# Patient Record
Sex: Female | Born: 1970 | Hispanic: Refuse to answer | Marital: Married | Smoking: Former smoker
Health system: Southern US, Community
[De-identification: ages and names within clinical notes are randomized; demographics above are authoritative.]

## PROBLEM LIST (undated history)

## (undated) ENCOUNTER — Other Ambulatory Visit: Admission: RE | Payer: Self-pay | Source: Ambulatory Visit | Admitting: Primary Care

## (undated) DIAGNOSIS — M329 Systemic lupus erythematosus, unspecified: Secondary | ICD-10-CM

## (undated) DIAGNOSIS — D75839 Thrombocytosis, unspecified: Secondary | ICD-10-CM

## (undated) DIAGNOSIS — IMO0002 Reserved for concepts with insufficient information to code with codable children: Secondary | ICD-10-CM

## (undated) DIAGNOSIS — D6861 Antiphospholipid syndrome: Secondary | ICD-10-CM

## (undated) DIAGNOSIS — R58 Hemorrhage, not elsewhere classified: Secondary | ICD-10-CM

## (undated) DIAGNOSIS — I10 Essential (primary) hypertension: Secondary | ICD-10-CM

## (undated) DIAGNOSIS — G43909 Migraine, unspecified, not intractable, without status migrainosus: Secondary | ICD-10-CM

## (undated) DIAGNOSIS — K219 Gastro-esophageal reflux disease without esophagitis: Secondary | ICD-10-CM

## (undated) DIAGNOSIS — F419 Anxiety disorder, unspecified: Secondary | ICD-10-CM

## (undated) HISTORY — PX: CARPAL TUNNEL RELEASE: SHX101

## (undated) HISTORY — PX: KNEE ARTHROSCOPY W/ LATERAL RETINACULAR REPAIR: SHX1875

## (undated) HISTORY — DX: Migraine, unspecified, not intractable, without status migrainosus: G43.909

## (undated) HISTORY — DX: Anxiety disorder, unspecified: F41.9

## (undated) HISTORY — DX: Gastro-esophageal reflux disease without esophagitis: K21.9

## (undated) HISTORY — DX: Essential (primary) hypertension: I10

## (undated) HISTORY — DX: Antiphospholipid syndrome: D68.61

## (undated) HISTORY — DX: Hemorrhage, not elsewhere classified: R58

---

## 2000-06-01 ENCOUNTER — Encounter: Payer: Self-pay | Admitting: Cardiovascular Disease

## 2009-11-25 ENCOUNTER — Emergency Department: Admit: 2009-11-25 | Disposition: A | Discharge status: Home / Self Care | Payer: Self-pay | Source: Ambulatory Visit

## 2009-11-25 ENCOUNTER — Encounter: Payer: Self-pay | Admitting: Medical

## 2009-11-25 DIAGNOSIS — M329 Systemic lupus erythematosus, unspecified: Secondary | ICD-10-CM | POA: Insufficient documentation

## 2009-11-25 HISTORY — DX: Systemic lupus erythematosus, unspecified: M32.9

## 2009-11-25 LAB — CBC AND DIFFERENTIAL
Baso # K/uL: 0 THOU/uL (ref 0.0–0.1)
Basophil %: 0.4 % (ref 0.1–1.2)
Eos # K/uL: 0.3 THOU/uL (ref 0.0–0.4)
Eosinophil %: 3 % (ref 0.7–5.8)
Hematocrit: 39 % (ref 34–45)
Hemoglobin: 13.2 g/dL (ref 11.2–15.7)
Lymph # K/uL: 1.9 THOU/uL (ref 1.2–3.7)
Lymphocyte %: 22.6 % (ref 19.3–51.7)
MCV: 87 fL (ref 79–95)
Mono # K/uL: 0.7 THOU/uL (ref 0.2–0.9)
Monocyte %: 8.3 % (ref 4.7–12.5)
Neut # K/uL: 5.5 THOU/uL (ref 1.6–6.1)
RBC: 4.5 MIL/uL (ref 3.9–5.2)
RDW: 12.8 % (ref 11.7–14.4)
Seg Neut %: 65.3 % (ref 34.0–71.1)
WBC: 8.4 THOU/uL (ref 4.0–10.0)

## 2009-11-25 LAB — COMPREHENSIVE METABOLIC PANEL
ALT: 17 U/L (ref 0–35)
AST: 27 U/L (ref 0–35)
Albumin: 4.6 g/dL (ref 3.5–5.2)
Alk Phos: 31 U/L — ABNORMAL LOW (ref 35–105)
Anion Gap: 11 (ref 7–16)
Bilirubin,Total: 0.3 mg/dL (ref 0.0–1.2)
CO2: 27 mmol/L (ref 20–28)
Calcium: 9 mg/dL (ref 8.8–10.2)
Chloride: 102 mmol/L (ref 96–108)
Creatinine: 0.97 mg/dL — ABNORMAL HIGH (ref 0.51–0.95)
GFR,Black: >59 *
GFR,Caucasian: >59 *
Globulin: 2.7 g/dL (ref 2.7–4.3)
Glucose: 94 mg/dL (ref 74–106)
Lab: 15 mg/dL (ref 6–20)
Potassium: 4.6 mmol/L (ref 3.3–5.1)
Sodium: 140 mmol/L (ref 133–145)
Total Protein: 7.3 g/dL (ref 6.3–7.7)

## 2009-11-25 LAB — LIPASE: Lipase: 25 U/L (ref 13–60)

## 2009-11-25 LAB — POCT URINALYSIS DIPSTICK
Blood,UA: NEGATIVE
Glucose,UA: NORMAL
Ketones, UA: NEGATIVE
Lot #: 20419701
Nitrite,UA POCT: NEGATIVE
PH,Ur: 5

## 2009-11-25 LAB — POCT URINE PREGNANCY
Lot #: 204187
Preg Test,UR POC: NEGATIVE

## 2009-11-25 LAB — SLIDE NUMBER: Slide # (Heme): 2112

## 2009-11-25 LAB — AMYLASE: Amylase: 61 U/L (ref 28–100)

## 2009-11-25 MED ORDER — ONDANSETRON HCL 2 MG/ML IV SOLN *I*
4.0000 mg | Freq: Once | INTRAMUSCULAR | Status: DC
Start: 2009-11-25 — End: 2009-11-26

## 2009-11-25 MED ORDER — SODIUM CHLORIDE 0.9 % IV BOLUS *I*
1000.0000 mL | Status: DC
Start: 2009-11-25 — End: 2009-11-26
  Administered 2009-11-25: 1000 mL via INTRAVENOUS

## 2009-11-25 MED ORDER — ONDANSETRON HCL 2 MG/ML IV SOLN *I*
INTRAMUSCULAR | Status: AC
Start: 2009-11-25 — End: 2009-11-25
  Administered 2009-11-25: 4 mg
  Filled 2009-11-25: qty 2

## 2009-11-25 MED ORDER — MORPHINE SULFATE 4 MG/ML IJ SOLN
4.0000 mg | Freq: Once | INTRAMUSCULAR | Status: DC
Start: 2009-11-25 — End: 2009-11-26

## 2009-11-25 MED ORDER — MORPHINE SULFATE 4 MG/ML IJ SOLN
INTRAMUSCULAR | Status: AC
Start: 2009-11-25 — End: 2009-11-25
  Administered 2009-11-25: 4 mg
  Filled 2009-11-25: qty 1

## 2009-11-25 NOTE — ED Notes (Signed)
 Upper abd pain x 1 day, nauseated but denies any emesis

## 2009-11-25 NOTE — ED Notes (Signed)
To US via stretcher.

## 2009-11-25 NOTE — ED Provider Notes (Signed)
 History   Chief Complaint   Patient presents with   . Abdominal Pain       HPI Comments: 39 yo patient with history of lupus, presents with abdominal pain. Patient states "I think its my gall bladder". Patient has epigastric/RUQ abdominal pain for the last day after an episode of drinking the night before. Pain is worse with eating. Mild radiation of pain to the upper back. Denies fevers, chills, vomiting, diarrhea, urinary/vaginal symptoms. Patient reports a strong family history of having cholecystectomies at young ages.     Patient is a 39 y.o. female presenting with abdominal pain. The history is provided by the patient. No language interpreter was used.   Abdominal Pain  The primary symptoms of the illness include abdominal pain and nausea. The primary symptoms of the illness do not include fever, fatigue, shortness of breath, vomiting, diarrhea, hematemesis, hematochezia, dysuria, vaginal discharge or vaginal bleeding. The current episode started yesterday. The onset of the illness was gradual. The problem has been gradually worsening.   The abdominal pain is located in the epigastric region and RUQ. The abdominal pain radiates to the back. The abdominal pain is exacerbated by eating.   The illness is associated with alcohol use and eating. The patient states that she believes she is currently not pregnant. The patient has not had a change in bowel habit. Additional symptoms associated with the illness include anorexia, heartburn and back pain. Symptoms associated with the illness do not include chills, diaphoresis, constipation, urgency, hematuria or frequency.       Past Medical History   Diagnosis Date   . Lupus          History reviewed.  No pertinent past surgical history.    History reviewed.  No pertinent family history.     reports that she drinks alcohol.  She reports that she does not currently use illicit drugs.    Review of Systems   Review of Systems   Constitutional: Negative for fever,  chills, diaphoresis and fatigue.   Respiratory: Negative for chest tightness and shortness of breath.    Cardiovascular: Negative for chest pain.   Gastrointestinal: Positive for heartburn, nausea, abdominal pain and anorexia. Negative for vomiting, diarrhea, constipation, hematochezia, abdominal distention, anal bleeding and hematemesis.   Genitourinary: Negative for dysuria, urgency, frequency, hematuria, vaginal bleeding and vaginal discharge.   Musculoskeletal: Positive for back pain.   Neurological: Negative for weakness and numbness.       Physical Exam   BP 151/97  Pulse 61  Temp(Src) 36.2 C (97.2 F) (Temporal)  Resp 18  Ht 1.676 m (5\' 6" )  Wt 101.152 kg (223 lb)  BMI 35.99 kg/m2  SpO2 100%  LMP 11/01/2009    Physical Exam   Nursing note and vitals reviewed.  Constitutional: She is oriented to person, place, and time. She appears well-developed and well-nourished.   HENT:   Head: Normocephalic and atraumatic.   Eyes: Conjunctivae and EOM are normal. Pupils are equal, round, and reactive to light.   Cardiovascular: Normal rate, regular rhythm and normal heart sounds.  Exam reveals no gallop and no friction rub.    No murmur heard.  Pulmonary/Chest: Effort normal and breath sounds normal. No respiratory distress. She has no decreased breath sounds. She has no wheezes. She has no rhonchi. She has no rales. She exhibits no tenderness.   Abdominal: Soft. Bowel sounds are normal. She exhibits no distension and no mass. Tenderness is present in the right upper quadrant and  epigastric area. She has no rigidity, no rebound, no guarding, no CVA tenderness, no pain at McBurney's point and no Murphy's sign.        Musculoskeletal: Normal range of motion. She exhibits no edema and no tenderness.   Neurological: She is alert and oriented to person, place, and time. She has normal strength. No cranial nerve deficit or sensory deficit.   Skin: Skin is warm, dry and intact.       Medical Decision Making    MDM  Number of Diagnoses or Management Options  Diagnosis management comments: Patient seen by me today, 11/25/2009 at 2225    Assessment:  39 y.o., female comes to the ED with abdominal pain  Differential Diagnosis includes gastritis, cholecystitis/lithiasis, uti, preg, generalized abd. pain  Plan: labs, ivf, analgesia, urine dip/preg, u/s, reassess.          Amount and/or Complexity of Data Reviewed  Clinical lab tests: ordered and reviewed  Tests in the radiology section of CPT: ordered and reviewed    Risk of Complications, Morbidity, and/or Mortality  Presenting problems: moderate  Diagnostic procedures: moderate  Management options: moderate          Erlinda Hong, PA

## 2009-11-26 ENCOUNTER — Encounter: Payer: Self-pay | Admitting: Medical

## 2009-11-26 LAB — COMMENTS, DIFF

## 2009-11-26 NOTE — Discharge Instructions (Signed)
Be sure to follow up with your primary care physician as soon as possible for further evaluation of your symptoms.  Take home medication as prescribed.  Take percocet that you have at home as prescribed for your pain.  Return to the ED with changing/worsening of your symptoms, or with any other concerns.

## 2011-11-28 ENCOUNTER — Other Ambulatory Visit: Payer: Self-pay | Admitting: Gastroenterology

## 2011-11-28 ENCOUNTER — Emergency Department
Admit: 2011-11-28 | Disposition: A | Discharge status: Home / Self Care | Payer: Self-pay | Source: Ambulatory Visit | Attending: Emergency Medicine | Admitting: Emergency Medicine

## 2011-11-28 ENCOUNTER — Encounter: Payer: Self-pay | Admitting: Emergency Medicine

## 2011-11-28 LAB — CBC AND DIFFERENTIAL
Baso # K/uL: 0 10*3/uL (ref 0.0–0.1)
Basophil %: 0.3 % (ref 0.1–1.2)
Eos # K/uL: 0.2 10*3/uL (ref 0.0–0.4)
Eosinophil %: 2.5 % (ref 0.7–5.8)
Hematocrit: 41 % (ref 34–45)
Hemoglobin: 13.5 g/dL (ref 11.2–15.7)
Lymph # K/uL: 1.7 10*3/uL (ref 1.2–3.7)
Lymphocyte %: 28.7 % (ref 19.3–51.7)
MCV: 88 fL (ref 79–95)
Mono # K/uL: 0.5 10*3/uL (ref 0.2–0.9)
Monocyte %: 8.7 % (ref 4.7–12.5)
Neut # K/uL: 3.5 10*3/uL (ref 1.6–6.1)
Platelets: 97 10*3/uL — ABNORMAL LOW (ref 160–370)
RBC: 4.6 MIL/uL (ref 3.9–5.2)
RDW: 12.8 % (ref 11.7–14.4)
Seg Neut %: 59.5 % (ref 34.0–71.1)
WBC: 6 10*3/uL (ref 4.0–10.0)

## 2011-11-28 LAB — BASIC METABOLIC PANEL
Anion Gap: 9 (ref 7–16)
CO2: 25 mmol/L (ref 20–28)
Calcium: 8.5 mg/dL — ABNORMAL LOW (ref 8.8–10.2)
Chloride: 103 mmol/L (ref 96–108)
Creatinine: 1.02 mg/dL — ABNORMAL HIGH (ref 0.51–0.95)
GFR,Black: 79 *
GFR,Caucasian: 69 *
Glucose: 82 mg/dL (ref 60–99)
Lab: 18 mg/dL (ref 6–20)
Potassium: 4.2 mmol/L (ref 3.3–5.1)
Sodium: 137 mmol/L (ref 133–145)

## 2011-11-28 LAB — LACTATE, VENOUS, WHOLE BLOOD: Lactate VEN,WB: 0.6 mmol/L (ref 0.5–2.2)

## 2011-11-28 MED ORDER — ACETAMINOPHEN-CODEINE 300-30 MG PO TABS *I*
1.0000 | ORAL_TABLET | ORAL | Status: AC | PRN
Start: 2011-11-28 — End: 2011-12-01

## 2011-11-28 MED ORDER — ALBUTEROL SULFATE HFA 108 (90 BASE) MCG/ACT IN AERS *I*
2.0000 | INHALATION_SPRAY | Freq: Once | RESPIRATORY_TRACT | Status: DC
Start: 2011-11-28 — End: 2011-11-29

## 2011-11-28 MED ORDER — ACETAMINOPHEN-CODEINE 300-30 MG PO TABS *I*
2.0000 | ORAL_TABLET | Freq: Once | ORAL | Status: AC
Start: 2011-11-28 — End: 2011-11-28
  Administered 2011-11-28: 2 via ORAL
  Filled 2011-11-28: qty 2

## 2011-11-28 MED ORDER — SODIUM CHLORIDE 0.9 % IV BOLUS *I*
1000.0000 mL | Freq: Once | Status: AC
Start: 2011-11-28 — End: 2011-11-28
  Administered 2011-11-28: 1000 mL via INTRAVENOUS

## 2011-11-28 NOTE — Discharge Instructions (Signed)
You need to follow up with your primary care physician, please call within 48 hours to schedule an appointment to be seen in 3-5 days.

## 2011-11-28 NOTE — ED Provider Notes (Signed)
History     Chief Complaint   Patient presents with   . Cough     Pt to ED with c/o L upper back pain and L sided chest pain.  Pt reports productive cough for 3 weeks.  Pt has completed z-pak.  Reports some improvement in symptoms.  Referred to ED by urgent care.  Pt also reports dizziness.     HPI Comments: Per pt with several weeks of productive cough. Took azithromycin 2 weeks ago with resolution of symptoms.   Pt now has shoulder pain, ad is worried that as she has lupus and antiphospholipid syndrome and is worried that she has a PE. No prior history of clots. Pt is active, no hormone use. Denies fever, CP or SOB. No leg swelling.     The history is provided by the patient.       Past Medical History   Diagnosis Date   . Lupus    . Antiphospholipid antibody syndrome             Past Surgical History   Procedure Laterality Date   . Breast surgery         Family History   Problem Relation Age of Onset   . Other Mother          Social History      reports that she has been smoking Cigarettes.  She has been smoking about 0.00 packs per day. She does not have any smokeless tobacco history on file. She reports that  drinks alcohol. She reports that she does not use illicit drugs. [name].    Living Situation    Questions Responses    Patient lives with Spouse    Homeless     Caregiver for other family member     External Services     Employment Employed    Domestic Violence Risk           Review of Systems   Review of Systems   Constitutional: Negative for fever and chills.   HENT: Negative for congestion.    Respiratory: Positive for cough and chest tightness. Negative for shortness of breath.    Cardiovascular: Negative for chest pain.   Gastrointestinal: Negative for nausea, vomiting, abdominal pain, diarrhea and constipation.   Genitourinary: Negative for dysuria.   Skin: Negative for color change.   Neurological: Negative for dizziness, light-headedness and headaches.    Psychiatric/Behavioral: Negative for confusion.       Physical Exam       ED Triage Vitals   BP Heart Rate Heart Rate(via Pulse Ox) Resp Temp Temp Source SpO2 O2 Device O2 Flow Rate   11/28/11 1451 11/28/11 1451 -- 11/28/11 1451 11/28/11 1451 11/28/11 1451 11/28/11 1451 11/28/11 1451 --   117/71 mmHg 61  18 36.3 C (97.3 F) TEMPORAL 100 % None (Room air)       Weight           11/28/11 1451           90.719 kg (200 lb)               Physical Exam   Nursing note and vitals reviewed.  Constitutional: She is oriented to person, place, and time. She appears well-developed and well-nourished.   HENT:   Head: Normocephalic and atraumatic.   Right Ear: External ear normal.   Left Ear: External ear normal.   Mouth/Throat: Oropharynx is clear and moist.   Eyes:  EOM are normal.   Neck: Normal range of motion.   Cardiovascular: Normal rate, regular rhythm and normal heart sounds.    Pulmonary/Chest: Effort normal and breath sounds normal. No respiratory distress. She has no wheezes. She has no rales.   Coarse cough    Abdominal: Soft. Bowel sounds are normal. She exhibits no distension.   Musculoskeletal: Normal range of motion.   Neurological: She is alert and oriented to person, place, and time.   Skin: Skin is warm and dry.   Psychiatric: She has a normal mood and affect. [name]. Judgment and thought content normal.       Medical Decision Making      Amount and/or Complexity of Data Reviewed  Clinical lab tests: ordered  Tests in the radiology section of CPT: ordered  Review and summarize past medical records: yes  Independent visualization of images, tracings, or specimens: yes        Initial Evaluation:  ED First Provider Contact    Date/Time Event User Comments    11/28/11 1454 ED Provider First Contact Tasha Gonzalez Initial Face to Face Provider Contact          Patient seen by me as above    Assessment:  41 y.o., female comes to the ED with cough ,chest tightness    Differential Diagnosis includes  viral illness, RAD, PE less likely              Plan: discussed with pt risk of pe- she does not want a Ct chest at this time. Will check CXR, labs, EKG. Analgesia as needed. Tele, iv fluids, anticipate discharge to home.       Tasha Schewe, DO    Tasha Ligas, DO  11/28/11 1738

## 2011-11-28 NOTE — ED Notes (Addendum)
Pt d/c ambulatory to home with friend.  Gait steady, offers no c/o.  Unable to obtain inhaler from ED pyxis per Allegra Grana.  Pt states she has inhaler at home.  ED provider to be notified.

## 2011-12-04 LAB — BLOOD CULTURE: Bacterial Blood Culture: 0

## 2011-12-14 LAB — EKG 12-LEAD
P: 23 degrees
QRS: 67 degrees
Rate: 53 {beats}/min
Severity: NORMAL
Severity: NORMAL
T: 56 degrees

## 2015-05-16 ENCOUNTER — Emergency Department
Admission: EM | Admit: 2015-05-16 | Disposition: A | Discharge status: Home / Self Care | Payer: Self-pay | Source: Ambulatory Visit | Attending: Emergency Medicine | Admitting: Emergency Medicine

## 2015-05-16 ENCOUNTER — Encounter: Payer: Self-pay | Admitting: Emergency Medicine

## 2015-05-16 LAB — POCT URINE PREGNANCY: Lot #: 121120

## 2015-05-16 MED ORDER — DIPHENHYDRAMINE HCL 25 MG PO TABS *I*
50.0000 mg | ORAL_TABLET | Freq: Once | ORAL | Status: AC
Start: 2015-05-16 — End: 2015-05-16
  Administered 2015-05-16: 50 mg via ORAL

## 2015-05-16 MED ORDER — KETOROLAC TROMETHAMINE 30 MG/ML IJ SOLN *I*
30.0000 mg | Freq: Once | INTRAMUSCULAR | Status: AC
Start: 2015-05-16 — End: 2015-05-16
  Administered 2015-05-16: 30 mg via INTRAMUSCULAR
  Filled 2015-05-16: qty 1

## 2015-05-16 MED ORDER — DIPHENHYDRAMINE HCL 25 MG PO TABS *I*
ORAL_TABLET | ORAL | Status: DC
Start: 2015-05-16 — End: 2015-05-16
  Filled 2015-05-16: qty 2

## 2015-05-16 NOTE — ED Provider Notes (Addendum)
History     Chief Complaint   Patient presents with    Headache     HA, blurred vision, dizziness for the past "several days."  Saw PCP this morning.  Referred to neurologist.  Neurologist will not see her without PCP changing referral.  Here today for evaluation.     HPI Comments: Tasha Gonzalez is a 45 y.o. female with history of SLE who presents with head pains.  She reports that she has been experiencing intermittent episodes for the past one week of "shooting sensations" in her head, in a variety of locations, not always the same place in her head.  She reports that the sensation is "very brief" but is associated with a number of other symptoms which include "flashing spots" in her vision, sensitivity to light, and lightheadedness.  She asserts that she is not having a "headache".  These episodes have been gradually increasing in frequency over the past 1 week. She has attempted to reduce the brightness on her computer screen at work as she thought this might be a trigger, but symptoms have continued.     She was evaluated by her primary care physician today who referred her to see a neurologist.  However, she was contacted by the neurology clinic to which she was referred and informed that they would not be able to evaluate her because the referral indicated that she needed to see them for a "headache".  She called her PCP to ask them to "change" the details of the referral but was unable to reach them, and so decided to come to the emergency department to be evaluated. She is not having any symptoms presently.       Past Medical History:   Diagnosis Date    Antiphospholipid antibody syndrome     Lupus         Past Surgical History:   Procedure Laterality Date    BREAST SURGERY       Family History   Problem Relation Age of Onset    Other Mother        Social History    reports that she has been smoking Cigarettes.  She does not have any smokeless tobacco history on file. She reports that she  drinks alcohol. She reports that she does not use illicit drugs. Her sexual activity history is not on file.    Living Situation     Questions Responses    Patient lives with Spouse    Homeless     Caregiver for other family member     External Services     Employment Employed    Domestic Violence Risk           Problem List     Patient Active Problem List   Diagnosis Code    Lupus M32.9       Review of Systems   Review of Systems   Constitutional: Negative for activity change, appetite change, chills, diaphoresis, fatigue and fever.   HENT: Negative for sore throat.    Eyes: Positive for photophobia and visual disturbance.   Respiratory: Negative for cough and shortness of breath.    Cardiovascular: Negative for chest pain.   Gastrointestinal: Negative for abdominal pain, nausea and vomiting.   Genitourinary: Negative for decreased urine volume and dysuria.   Musculoskeletal: Negative for neck pain and neck stiffness.   Skin: Negative for rash.   Neurological: Positive for light-headedness. Negative for seizures, syncope, facial asymmetry, speech difficulty, weakness and numbness.  Hematological: Negative for adenopathy.   Psychiatric/Behavioral: Negative for confusion.       Physical Exam     ED Triage Vitals   BP Heart Rate Heart Rate (via Pulse Ox) Resp Temp Temp src SpO2 O2 Device O2 Flow Rate   05/16/15 1029 05/16/15 1029 -- 05/16/15 1029 05/16/15 1029 -- 05/16/15 1029 -- --   170/93 60  18 36.4 C (97.5 F)  100 %        Weight           05/16/15 1029           92.5 kg (204 lb)                    Physical Exam   Constitutional: She is oriented to person, place, and time. She appears well-developed. No distress.   Sitting upright in bed, alert and talkative, talking on the phone with her mother.    HENT:   Head: Normocephalic and atraumatic.   Right Ear: External ear normal.   Left Ear: External ear normal.   Nose: Nose normal.   Mouth/Throat: Oropharynx is clear and moist. No oropharyngeal exudate.   Eyes:  Conjunctivae and EOM are normal. Pupils are equal, round, and reactive to light. Right eye exhibits no discharge. Left eye exhibits no discharge. No scleral icterus.   Neck: Normal range of motion. Neck supple. No rigidity. Normal range of motion present.   Cardiovascular: Normal rate.    Pulmonary/Chest: Effort normal. No respiratory distress.   Abdominal: Soft. She exhibits no distension. There is no tenderness. There is no guarding.   Musculoskeletal: Normal range of motion. She exhibits no edema.   Neurological: She is alert and oriented to person, place, and time. She has normal strength. She displays no tremor. No cranial nerve deficit or sensory deficit. She exhibits normal muscle tone. She displays no seizure activity. Gait normal. GCS eye subscore is 4. GCS verbal subscore is 5. GCS motor subscore is 6.   Skin: Skin is warm and dry. No rash noted. She is not diaphoretic.   Psychiatric: She has a normal mood and affect. [name]. Thought content normal.   Nursing note and vitals reviewed.      Medical Decision Making      Amount and/or Complexity of Data Reviewed  Decide to obtain previous medical records or to obtain history from someone other than the patient: yes  Review and summarize past medical records: yes        Initial Evaluation:  ED First Provider Contact     Date/Time Event User Comments    05/16/15 1031 ED Provider First Contact Esmond Hinch Initial Face to Face Provider Contact          Patient seen by me as above    Assessment:  45 y.o.female comes to the ED with intermittent episodes of "shooting sensations" in her head, associated with photophobia and vision disturbances. These symptoms appear to be most suspicious for atypical or complex migraines. I offered multiple options for treatment and pt has consented to a dose of toradol. We will refer pt for urgent outpatient neurology follow-up to be evaluated.     This is not consistent with a CVA - pt's neurologic exam is  normal.     Not consistent with SAH - not sudden onset, not worst headache of life, normal neurologic exam. Not consistent with meningitis - pt is well-appearing, has no neck pain or stiffness, no fever,  no confusion, normal neurologic exam, no rash/petechiae.    Differential Diagnosis includes headache, tension vs migraine; not c/w SAH or meningitis; not c/w CVA.     Plan:   Therapeutic: analgesia     Salem Senate, MD         Salem Senate, MD  05/16/15 1118        ==============   Update:     Pt received IM toradol. She developed hives at the site of the injection and widespread pruritis. She denies facial / oral swelling, difficulty breathing / swallowing. She remains well appearing, is breathing comfortably. The reaction appears to be localized to the site of the injection, and appears to be consistent with an allergy. Pt given benadryl. She is eager to go home. Discharged with instructions to continue to use benadryl as needed. Reasons to return discussed with patient who understands.     ==============       Salem Senate, MD  05/16/15 1315

## 2015-05-16 NOTE — ED Notes (Signed)
Called to room d/t c/o allergic reaction after administration of toradol. + itching, hives, and swelling and redness to injection site. Attending aware, medicated po benadryl. Patient denies any shortness of breath, difficulty swallowing, or acute distress.

## 2015-05-16 NOTE — Discharge Instructions (Signed)
While in the Emergency Department you received a Toradol injection.    At home, get lots of rest, drink plenty of fluids, and eat a healthy diet. You may take naproxen, excedrin, or tylenol over-the-counter as needed, use as directed.     If any of your symptoms worsen or you develop additional symptoms that are concerning to you, follow up with your primary care doctor or return to the Emergency Department.

## 2015-05-16 NOTE — ED Notes (Signed)
Patient having 4-5 days of a headache with some eye muscle twitching and photo sensitivity. Patient is alert and oriented, following commands. Also has hx of lupus. Will medicate for pain per order once result of pregnancy test is back. Will monitor.

## 2015-05-16 NOTE — ED Notes (Signed)
Patient aware of all discharge instructions and verbalized understanding. Patient able to identify all s/s of need to return to ED. Instructed to return if symptoms persist or worsen. Patient instructed if received any narcotic/sedating medication today while at [facility] ED, to refrain from driving or consuming alcohol. Patient confirms understanding of all instructions given.  Suhana Wilner RN

## 2015-06-20 ENCOUNTER — Other Ambulatory Visit: Payer: Self-pay | Admitting: Neurology

## 2015-06-20 ENCOUNTER — Encounter: Payer: Self-pay | Admitting: Neurology

## 2015-06-20 ENCOUNTER — Ambulatory Visit: Payer: Self-pay | Admitting: Neurology

## 2015-06-20 VITALS — BP 153/96 | HR 56 | Ht 66.0 in | Wt 204.0 lb

## 2015-06-20 DIAGNOSIS — G43909 Migraine, unspecified, not intractable, without status migrainosus: Secondary | ICD-10-CM

## 2015-06-20 LAB — BASIC METABOLIC PANEL
Anion Gap: 13 (ref 7–16)
CO2: 26 mmol/L (ref 20–28)
Calcium: 8.9 mg/dL (ref 8.8–10.2)
Chloride: 101 mmol/L (ref 96–108)
Creatinine: 1.22 mg/dL — ABNORMAL HIGH (ref 0.51–0.95)
GFR,Black: 62 *
GFR,Caucasian: 54 * — AB
Glucose: 75 mg/dL (ref 60–99)
Lab: 14 mg/dL (ref 6–20)
Potassium: 4.3 mmol/L (ref 3.3–5.1)
Sodium: 140 mmol/L (ref 133–145)

## 2015-06-20 LAB — CBC
Hematocrit: 40 % (ref 34–45)
Hemoglobin: 13.2 g/dL (ref 11.2–15.7)
MCH: 29 pg/cell (ref 26–32)
MCHC: 33 g/dL (ref 32–36)
MCV: 89 fL (ref 79–95)
Platelets: 79 10*3/uL — ABNORMAL LOW (ref 160–370)
RBC: 4.5 MIL/uL (ref 3.9–5.2)
RDW: 12.8 % (ref 11.7–14.4)
WBC: 4.2 10*3/uL (ref 4.0–10.0)

## 2015-06-20 LAB — FOLLICLE STIMULATING HORMONE: FSH: 20.2 m[IU]/mL

## 2015-06-20 LAB — LUTEINIZING HORMONE: LH: 17.4 m[IU]/mL

## 2015-06-20 MED ORDER — AMITRIPTYLINE HCL 25 MG PO TABS *I*
25.0000 mg | ORAL_TABLET | Freq: Every evening | ORAL | 5 refills | Status: DC
Start: 2015-06-20 — End: 2015-06-20

## 2015-06-20 MED ORDER — INDOMETHACIN 25 MG PO CAPS *I*
25.0000 mg | ORAL_CAPSULE | Freq: Three times a day (TID) | ORAL | 1 refills | Status: DC | PRN
Start: 2015-06-20 — End: 2016-01-03

## 2015-06-20 MED ORDER — INDOMETHACIN 25 MG PO CAPS *I*
25.0000 mg | ORAL_CAPSULE | ORAL | 1 refills | Status: DC | PRN
Start: 2015-06-20 — End: 2015-06-20

## 2015-06-20 MED ORDER — AMITRIPTYLINE HCL 25 MG PO TABS *I*
25.0000 mg | ORAL_TABLET | Freq: Every evening | ORAL | 5 refills | Status: DC
Start: 2015-06-20 — End: 2016-04-07

## 2015-06-20 NOTE — Progress Notes (Signed)
Pt. Name:  Tasha Gonzalez  DOB:   [birth year]  MRN:   [id number]  PCP:   Maudie Flakes, MD    Tasha Gonzalez is a 45 y.o. right handed female being evaluated by Neurology for head pains over the past 6-7 months. She denies any previous hx of headaches and these started with sensations near the top of her head to the right or above and behind the ear on the left lasting seconds several times a day, or several days without, of a "flashing" fleeting sharpish pain. There was no visual sx with that or other assoc neurologic sx's, and it almost exclusively happened at work.     About 5 months ago she began having muscle twitching over the left eye throughout the day on days she'd have the flashing pains.    In the past 4 mos she's also had 4-5 episodes lasting 3-4 days where she'd feel light headed, slightly off balance, and have tightness in her neck with pain radiating from her neck b/l up to her head with photo, sono and osmophobia and a slight sense of blurred vision w/o scintillations or other visual sx's, nor any n/v. She tried reducing the nicotine concentration in her vapor eCigs and ibuprofen w/o benefit, and for a few of the last headache episodes took her husband's 10 mg flexeril 1 or 2 days in a row - that put her to sleep, and seemed to help moderately. She was seen in the ED 05/16/15 for the headache, had a localized rash with IM toradol, and her BP was elevated - 170/93 - she denies a hx of htn. In general she feels her acuity has been les recently, but saw her optometrist last about a year ago, she needed no correction, so just wears magnifiers as needed.    She is probably in the midst of menopause with some night sweats, 3 periods a year 1 and 2 years ago, then no period for 6 mos from Sept til this April when she had 2. Her mother and mat Grandmother both had hysterectomies, ?for DUB.     She has SLE and antiphospholipid syndrome, but no major complications from lupus or thrombosis and has been on plaquenil  since 2006, she says more for lab abn than a clinical syndrome - she has intermittent rashes, arthralgias hips, knees, shoulders and elbows, and paresthesias in her fingers and toes. She has not actually seen a rheumatologist for several years and is just establishing care with a new PCP, and saw an OB last fall, but did not get the intended blood work.    PMH:    Past Medical History:   Diagnosis Date    Antiphospholipid antibody syndrome     Lupus      Past Surgical History:   Procedure Laterality Date    BREAST SURGERY           Medications:    Current Outpatient Prescriptions   Medication Sig    indomethacin (INDOCIN) 25 MG capsule Take 1 capsule (25 mg total) by mouth 3 times daily as needed (headache maximum 3 doses in one day, 3 days in a row)    amitriptyline (ELAVIL) 25 MG tablet Take 1 tablet (25 mg total) by mouth nightly    hydroxychloroquine (PLAQUENIL) 200 MG tablet Take 400 mg by mouth daily.    aspirin 81 MG chewable tablet Take 81 mg by mouth daily.    Vitamin D3 (CHOLECALCIFEROL) 1000 UNIT capsule Take 2,000 Units by mouth daily.  Allergies:    Allergies   Allergen Reactions    Toradol [Ketorolac Tromethamine] Hives and Swelling       Social Hx:    History   Alcohol Use    Yes     Comment: occasional     History   Smoking Status    Former Smoker    Types: Cigarettes, E-Cigarette    Quit date: 06/20/2010   Smokeless Tobacco    Not on file     Comment: pt has been using an e-cigarette for the past 5 years      History   Drug Use No       Family History:    Family History   Problem Relation Age of Onset    Other Mother        Review of Systems:    13 systems were reviewed via ROS questionnaire. In addition to above she notes diff with memory and concentration, cardiac awareness, constipation and diff with sleeping 2/2 anxiety. No unintended weight changes, dyspnea, difficulty with chewing or swallowing, nausea, vomiting, changes in bladder function, nor focal pain or weakness in the  extremities. Please see scanned questionnaire in the EHR media tab.      Physical Exam:        Visit Vitals    BP (!) 153/96    Pulse 56    Ht 1.676 m (5\' 6" )    Wt 92.5 kg (204 lb)    LMP 06/13/2015 (Approximate)    BMI 32.93 kg/m2       On examination she appears well, head is normocephalic, there are no bruits, neck is supple without adenopathy and there is normal lateral neck flexion for age.     On neurologic exam mental status is normal, oriented to time, person and place with intact concentration, attention, recent and remote memory, fund of knowledge and language.  Cranial nerves II-XII are symmetrically intact: including full smooth extraocular movements and normal visual fields, pupils and symmetric facial sensation and movement, hearing, speech and neck power. Discs are sharp w/o sign of elevated icp. Muscle tone, bulk and power are normal throughout the upper and lower extremities. Sensory testing is intact for primary modalities and symmetrically normal. Reflexes are symmetric and intact in the upper and lower extremities, toes are down going, and coordination and gait are unremarkable.      IMPRESSION:      ICD-10-CM ICD-9-CM   1. Migraine G43.909 346.90     Migrainous headaches with tension type features probably exacerbated by hormonal changes, poor sleep and stress at work.    Elevated blood pressure    ?perimenopause        SUGGEST/PLAN:    For her headaches and assoc sx's will try amitriptyline 25 qhs and increase to 50 mg in 2 weeks if tolerated but inadequate. In addition will try indocin 25 mg up to 3 in a day, max 3 days in a row, for more severe lasting headaches (she'll be cautious and have benadryl on hand given local possible rxn to im toradol). Consider imaging if not improving.    Sent for screening CBC and BMP esp r/t SLE effects and also getting LH and FSH intended by OB.    Defer to primary care regarding pursuing htn if persistent when not in pain and relaxed, and/or need to  f/u with rheumatology.        f/u:  10 wks      Tasha PartSETH Gaile Allmon, MD  Cc:  Maudie Flakes, MD  Salem Senate, MD

## 2015-06-20 NOTE — Patient Instructions (Signed)
Amitriptyline 25 mg    take one at bedtime - after 2 weeks increase to 2 each night if tolerating it well but not helping    Indomethacin 25 mg    Take one at outset of more severe headache and neck tightness, can repeat after 4 hours twice if needed - watch out for GI upset - take with food    Check lab works    Call or contact me with MyChart to make adjustments if needed before next appt

## 2015-06-21 NOTE — Communication Body (Addendum)
Hi Dr Chales AbrahamsGupta    Her creat (1.22) is mildly elevated and plts low (79k) - both slightly more so than 3 years ago (1.02 and 97) and i don't have more recent intervening labs - i'll defer to you about those, her plaquenil, bp and if she needs to see rheum again    Thanks  sk

## 2015-07-10 ENCOUNTER — Telehealth: Payer: Self-pay | Admitting: Neurology

## 2015-07-10 NOTE — Telephone Encounter (Signed)
I returned Rosslyn's call and left her a voicemail.  I reiterated Dr. Donia AstKolkin's message about using amitriptyline qHS and indomethacin PRN.  I explained that we would not recommend using a benzodiazepine for this problem and would defer to the PCP if it is necessary for management of another problem.  I asked that she call us back when she gets the message to ensure she understands our recommendations and so that we can clear up any confusion.

## 2015-07-10 NOTE — Telephone Encounter (Signed)
called - left message - explained we are trying the amitriptyline to reduce the freq of her stabbing flashing pain episodes and we should try the indocin prn the more lingering headaches. that shouldn't adversely effect bp and valium like meds wouldn't be a rx for headaches. can call back if questions and otherwise we can see how that works and adjust/discuss over time and at f/u appt.

## 2015-07-10 NOTE — Telephone Encounter (Signed)
Tasha Gonzalez called back.  She is taking amitriptyline at night but has not been using anything during the day on a PRN basis when she gets a headache.  She says that her PCP was concerned about indomethacin raising her blood pressure. She says that when she told her PCP that Flexeril worked well for her but was too strong for her to take during the day, he recommended a benzodiazepine instead.  She was told that this has the potential to help both the headaches and her blood pressure but later explained that it was thought it would help her anxiety more than her blood pressure.  He did not give her a prescription for this medication, and there was no further conversation about him doing so.  She is not really sure where to go from here.  I told her I would discuss the options with Dr. Delsa BernKolkin and can call her back tomorrow, but it remains that we will not be prescribing a benzodiazepine in place of indomethacin.

## 2015-07-10 NOTE — Telephone Encounter (Signed)
Pt called to discuss medication. Pt states her PCP put her on Amlodipine 5 MG daily primary feels the pt should have a benzo instead of indomethacin. Pt states she has held this med and has not started it please call to discuss changing.

## 2015-08-23 ENCOUNTER — Ambulatory Visit: Payer: Self-pay | Admitting: Neurology

## 2015-08-24 ENCOUNTER — Encounter: Payer: Self-pay | Admitting: Neurology

## 2015-11-16 ENCOUNTER — Encounter: Payer: Self-pay | Admitting: Gastroenterology

## 2015-12-05 ENCOUNTER — Ambulatory Visit: Payer: Self-pay | Admitting: Hematology

## 2015-12-05 ENCOUNTER — Other Ambulatory Visit
Admission: RE | Admit: 2015-12-05 | Discharge: 2015-12-05 | Disposition: A | Discharge status: Home / Self Care | Payer: Self-pay | Source: Ambulatory Visit | Attending: Hematology | Admitting: Hematology

## 2015-12-05 VITALS — BP 109/76 | HR 62 | Temp 98.4°F | Resp 18 | Ht 65.98 in | Wt 208.1 lb

## 2015-12-05 DIAGNOSIS — M329 Systemic lupus erythematosus, unspecified: Secondary | ICD-10-CM

## 2015-12-05 DIAGNOSIS — D696 Thrombocytopenia, unspecified: Secondary | ICD-10-CM | POA: Insufficient documentation

## 2015-12-05 LAB — HEPATITIS A,B,C,PANEL
HBV Core Ab: NEGATIVE
HBV S Ab Quant: 0 m[IU]/mL
HBV S Ab: NEGATIVE
HBV S Ag: NEGATIVE
Hep C Ab: NEGATIVE
Hepatitis A IGG: NEGATIVE

## 2015-12-05 LAB — CBC AND DIFFERENTIAL
Baso # K/uL: 0 10*3/uL (ref 0.0–0.1)
Basophil %: 0.5 %
Eos # K/uL: 0.1 10*3/uL (ref 0.0–0.4)
Eosinophil %: 2 %
Hematocrit: 41 % (ref 34–45)
Hemoglobin: 13.3 g/dL (ref 11.2–15.7)
IMM Granulocytes #: 0 10*3/uL (ref 0.0–0.1)
IMM Granulocytes: 0.5 %
Lymph # K/uL: 1.2 10*3/uL (ref 1.2–3.7)
Lymphocyte %: 29.6 %
MCH: 28 pg/cell (ref 26–32)
MCHC: 33 g/dL (ref 32–36)
MCV: 87 fL (ref 79–95)
Mono # K/uL: 0.3 10*3/uL (ref 0.2–0.9)
Monocyte %: 7 %
Neut # K/uL: 2.4 10*3/uL (ref 1.6–6.1)
Nucl RBC # K/uL: 0 10*3/uL (ref 0.0–0.0)
Nucl RBC %: 0 /100 WBC (ref 0.0–0.2)
Platelets: 110 10*3/uL — ABNORMAL LOW (ref 160–370)
RBC: 4.7 MIL/uL (ref 3.9–5.2)
RDW: 12.7 % (ref 11.7–14.4)
Seg Neut %: 60.4 %
WBC: 4 10*3/uL (ref 4.0–10.0)

## 2015-12-05 LAB — COMPREHENSIVE METABOLIC PANEL
ALT: 27 U/L (ref 0–35)
AST: 31 U/L (ref 0–35)
Albumin: 4.4 g/dL (ref 3.5–5.2)
Alk Phos: 31 U/L — ABNORMAL LOW (ref 35–105)
Anion Gap: 11 (ref 7–16)
Bilirubin,Total: 0.3 mg/dL (ref 0.0–1.2)
CO2: 28 mmol/L (ref 20–28)
Calcium: 9 mg/dL (ref 8.8–10.2)
Chloride: 100 mmol/L (ref 96–108)
Creatinine: 1.23 mg/dL — ABNORMAL HIGH (ref 0.51–0.95)
GFR,Black: 61 *
GFR,Caucasian: 53 * — AB
Glucose: 84 mg/dL (ref 60–99)
Lab: 15 mg/dL (ref 6–20)
Potassium: 4.7 mmol/L (ref 3.3–5.1)
Sodium: 139 mmol/L (ref 133–145)
Total Protein: 7.5 g/dL (ref 6.3–7.7)

## 2015-12-05 LAB — HIV 1&2 ANTIGEN/ANTIBODY: HIV 1&2 ANTIGEN/ANTIBODY: NONREACTIVE

## 2015-12-05 LAB — TIBC
Iron: 60 ug/dL (ref 34–165)
TIBC: 272 ug/dL (ref 250–450)
Transferrin Saturation: 22 % (ref 15–50)

## 2015-12-05 LAB — DUPLICATE SLIDE
Slide Sent To: 114
Slide Sent: 10:4 {titer}

## 2015-12-05 LAB — T4, FREE: Free T4: 1.3 ng/dL (ref 0.9–1.7)

## 2015-12-05 LAB — FERRITIN: Ferritin: 67 ng/mL (ref 10–120)

## 2015-12-05 LAB — TRANSFERRIN: Transferrin: 209 mg/dL (ref 200–360)

## 2015-12-05 LAB — TSH: TSH: 1.24 u[IU]/mL (ref 0.27–4.20)

## 2015-12-05 LAB — FOLATE: Folate: 9.5 ng/mL (ref >=4.6)

## 2015-12-05 LAB — VITAMIN B12: Vitamin B12: 436 pg/mL (ref 232–1245)

## 2015-12-05 NOTE — Patient Instructions (Signed)
Your care team: Dr. Akwaa  Dr. Akwaa works with multiple fellows who rotate annually, your current fellow is: Dr. Adrienne Victor  Primary team nurse: Brylynn Hanssen, RN  Team secretary: Morgan Knight     The office phone number is: [phone]     This phone will be answered by a secretary who will take your message Monday - Friday, 8:30 am - 4:30 pm. Please be prepared to let the secretary know why you are calling. This will help let your nurse know how urgent the call is.      This is also the same number that you should call after hours for any urgent needs. There is always a fellow on call.      *Please note, should you have any treatments scheduled in the [facility]fusion [facility], anyone age 14 and under will not be permitted to accompany you or wait in the [facility]fusion [facility] waiting area for safety reasons. Please plan accordingly.

## 2015-12-06 LAB — LUPUS ANTICOAGULANT PROFILE
Fibrinogen: 232 mg/dL (ref 172–409)
INR: 1.1 (ref 0.9–1.1)
Lupus Anticoagulant: POSITIVE — AB
Protime: 12.2 s (ref 10.0–12.9)
aPTT: 57.9 s — ABNORMAL HIGH (ref 25.8–37.9)

## 2015-12-06 LAB — RHEUMATOID FACTOR,SCREEN: Rheumatoid Factor: <10 IU/mL

## 2015-12-06 LAB — ANTI DS-DNA AB: dsDNA Ab: 8 IU/mL — ABNORMAL HIGH (ref 0–4)

## 2015-12-06 LAB — ANA TITER/PATTERN: ANA Titer: 640 — AB

## 2015-12-06 LAB — CYCLIC CITRULLINATED PEPTIDE: Cyclic Citrullin Peptide Ab: 4 U (ref 0–19)

## 2015-12-06 LAB — ANTINUCLEAR ANTIBODY SCREEN: ANA Screen: POSITIVE — AB

## 2015-12-06 LAB — SPEC COAG REVIEW

## 2015-12-07 LAB — ANTI-CARDIOLIPIN AB
Anticardiolipin IgG: >150 [GPL'U]/mL — ABNORMAL HIGH (ref 0–14)
Anticardiolipin IgM: 28 [MPL'U]/mL — ABNORMAL HIGH (ref 0–12)

## 2015-12-07 NOTE — H&P (Signed)
Hematology Clinic H&P    Patient ID:  Tasha Gonzalez is a 45 y.o. female     Reason for referral:  Thrombocytopenia    Referring provider: Waldon Merl, MD    History of Present Illness:  HPI  Tasha Gonzalez is a 45 y.o. female who presents to clinic unaccompanied for evaluation of moderate thrombocytopenia.  She has a history significant for lupus and antiphospholipid antibody syndrome on plaquenil.  Lupus was diagnosed in 2005 when she began having complications with a pregnancy, which ultimately resulted in fetal loss.  She does have a sister and paternal aunt with lupus as well.  She has been on plaquenil 400 daily; PCP writes this, she has not seen rheumatology in years.  She notes that her dose of plaquenil was reduced from 400 to 200 daily some months ago and isn't sure why.    She has noticed increased fatigue, joint pain (elbows, hips, knees).  She does sometimes have gum bleeding and reports prolonged periods for 11 days; no major bleeding, no black or bloody stool.  She has noticed some easy bruising.    She endorses having a "little bit of everything", review of systems notes history of migraines and headaches, vision changes with needing reader glasses recently, and some flank pain.  No fevers/chills, no unexpected weight loss, no night sweats.      Past Medical History:   Diagnosis Date    Antiphospholipid antibody syndrome     Lupus      Past Surgical History:   Procedure Laterality Date    BREAST SURGERY         Family History   Problem Relation Age of Onset    Other Mother        Allergies   Allergen Reactions    Toradol [Ketorolac Tromethamine] Hives and Swelling       Outpatient Prescriptions Marked as Taking for the 12/05/15 encounter (Clinical Support) with [facility]   Medication Sig Dispense Refill    AMLODIPINE BESYLATE PO Take by mouth daily      acetaminophen-codeine (TYLENOL #3) 300-30 MG per tablet Take by mouth every 4-6 hours as needed for Pain      amitriptyline  (ELAVIL) 25 MG tablet Take 1 tablet (25 mg total) by mouth nightly 30 tablet 5    hydroxychloroquine (PLAQUENIL) 200 MG tablet Take 200 mg by mouth daily          Social History     Social History    Marital status: Married     Spouse name: N/A    Number of children: N/A    Years of education: N/A     Occupational History    Not on file.     Social History Main Topics    Smoking status: Former Smoker     Types: Cigarettes, E-Cigarette     Quit date: 06/20/2010    Smokeless tobacco: Never Used      Comment: pt has been using an e-cigarette for the past 5 years     Alcohol use 3.0 oz/week     5 Glasses of wine per week      Comment: occasional    Drug use: No    Sexual activity: Not on file     Social History Narrative       Review of Systems:  General: denies fevers/chills, night sweats, major weight change, +fatigue  HEENT: +vision change - needs reader glasses   Pulm: denies cough, wheezing, shortness  of breath  CV: denies chest pain, palpitations  GI: denies nausea/vomiting, diarrhea, blood in stool, +flank pain  GU: +prolonged periods 7 days, + pregnancy loss  Extremities: denies swelling +joint pain  Heme: denies lumps/lymphandeopathy, +easy bruising and minor bleeding (Gum bleeding)  Skin: denies rashes, changes in hair/skin/nails    Physical Examination:  Vitals:    12/05/15 0801   BP: 109/76   Pulse: 62   Resp: 18   Temp: 36.9 C (98.4 F)   Weight: 94.4 kg (208 lb 1.6 oz)   Height: 167.6 cm (5' 5.98")     Gen: No acute distress, nontoxic appearance, alert and oriented   HEENT: normocephalic, no scleral icterus, extraocular movements intact, moist mucus membranes.    CV: regular rate and rhythm, no murmurs/gallops/rubs  Pulm: clear to ascultation bilaterally, unlabored.  No wheezes or ronchi.  GI: Abdomen soft, nontender, nondistended, bowel sounds positive.  No organomegally.  Extremities: no lower extremity edema, no cyanosis or clubbing  Onc: no lymphadenoapthy   Skin: no rashes, few small bruises  on arms (fingerprint size)    Lab Results:       Lab results: 12/05/15  0920 06/20/15  1430   WBC 4.0 4.2   Hemoglobin 13.3 13.2   Hematocrit 41 40   RBC 4.7 4.5   Platelets 110* 79*   Neut # K/uL 2.4  --    Lymph # K/uL 1.2  --    Mono # K/uL 0.3  --    Eos # K/uL 0.1  --    Baso # K/uL 0.0  --    Seg Neut % 60.4  --    Lymphocyte % 29.6  --    Monocyte % 7.0  --    Eosinophil % 2.0  --    Basophil % 0.5  --                Lab results: 12/05/15  0920 06/20/15  1430   Sodium 139 140   Potassium 4.7 4.3   Chloride 100 101   CO2 28 26   UN 15 14   Creatinine 1.23* 1.22*   GFR,Caucasian 53* 54*   GFR,Black 61 62   Glucose 84 75   Calcium 9.0 8.9   Total Protein 7.5  --    Albumin 4.4  --    ALT 27  --    AST 31  --    Alk Phos 31*  --    Bilirubin,Total 0.3  --             Lab results: 12/05/15  0920   TSH 1.24           Lab results: 12/05/15  0920   Protime 12.2   INR 1.1             Lab results: 12/05/15  0920   Ferritin 67   Iron 60   TIBC 272   Transferrin Saturation 22   Transferrin 209   Folate 9.5   Vitamin B12 436       Results for Tasha Gonzalez, Tasha Gonzalez (MRN [id number]) as of 12/07/2015 16:59   Ref. Range 12/05/2015 09:20   ANA Screen Latest Ref Range: NEGATIVE  POS (!)   ANA Pattern Unknown HOMOGENEOUS   ANA Titer Unknown 638 (!)   Cyclic Citrullin Peptide Ab Latest Ref Range: 0 - 19 U 4   dsDNA Ab Latest Ref Range: 0 - 4 IU/mL 8 (H)   Rheumatoid Factor Latest Units:  IU/mL <10   Hepatitis A IGG Unknown NEG   HBV S Ag Unknown NEG   HBV S Ab Unknown NEG   HBV S Ab Quant Latest Units: mIU/mL 0.00   HBV Core Ab Unknown NEG   HBV Interp Unknown see below   Hep C Ab Unknown NEG   HIV 1&2 ANTIGEN/ANTIBODY Unknown Nonreactive   Results for Tasha Gonzalez, Tasha Gonzalez (MRN [id number]) as of 12/07/2015 16:59   Ref. Range 12/05/2015 09:20   Lupus Anticoagulant Latest Ref Range: NEGATIVE  POS (!)   Anticardiolipin IgG Latest Ref Range: 0 - 14 GPL/mL >150 (H)   Anticardiolipin IgM Latest Ref Range: 0 - 12 MPL/mL 28 (H)       Imaging:  No  results found.      Pathology:   Peripheral smear 12/05/15  WBC: normogranular neutrophils, small blue lymphocytes, one basophil.  Scattered large granular lymphocytes.  RBC:  Normochromic, generally regular size and shape - few acanthocytes/teardrops/cigars, minimal fragments  Platelets: few small clumps (2-3 platelets at a time) fairly minimal overall.  Generally normal size and mostly normal staining; a few appear to have degranulated.  Estimated manual count around 120K  Impression: possible mild thrombocytopenia vs pseudothrombocytopenia      Assessment:   Tasha Gonzalez is a 45 y.o. female with history of lupus and prior low platelets coming in for evaluation of this today.  She has numerous systemic complaints; note she has a history of lupus and antiphospholipid antibody syndrome.  No symptoms concerning for a thrombosis or major bleeding and she has not history of thrombosis (though did experience pregnancy loss in 2005).  She is taking asa 81.  Her platelets have fluctuated in the past and today on differential are over 100.  Other cell lines are normal.  She denied risk factors for HIV or Hep; confirmed these are negative on testing.  She notes that her dose of plaquenil may have been reduced from 400 to 200 a few months ago when switching PCPs.  Certainly a lupus flair could explain both thrombocytopenia as well as her symptoms.  We have rechecked ANA and DSDNA as well as lupus anticoagulant profile and these were positive; DS-DNA was 8 (cutoff is 4) indicating she may be experiencing some flair.  CMP is normal, creatinine is slightly up at 1.2; we have placed a referral for rheumatology evaluation and meanwhile discussed that she should call her PCP to potentially increase dose of plaquenil in the interim.    Overall her platelet count is stable and may actually be normal if not for mild clumping; can trial drawing in EDTA-free tube in the future to see if counts normalize.      Recommendations:  --  checked CBC - platelets are now over 100K, other counts stable, normal differential  -- checked CMP - normal LFTs   -- HIV, hep panel negative  -- iron studies, B12, folate within normal limits  -- TSH, T4 within normal limits  -- smear with minimal clumping, otherwise normal  -- checked coags, normal PT/INR.  PTT prolonged consistent with lupus anticoagulant  -- lupus profile with positive ANA and DS-DNA at 8; consistent with lupus and some disease activity  -- placed referral to rheumatology  -- she will contact PCP about increasing plaquenil to prior dose 490m po every day    Follow up: RTC 4-6 weeks to reassess - can potentially cancel if no changes    Patient was seen and discussed with my attending, Dr. ADonalee Citrin  The patient expressed an understanding and agreement with the plan. All questions were answered. The patient knows to call our office with any concerns or questions should they arise.       Dorthula Nettles, MD  Hematology/Oncology Fellow      Attending addendum:  I discussed the above patient's plan of care with Dr. Christy Sartorius and the patient. I independently saw and evaluated the patient, performing key and critical portions of the history and physical exam as documented above. I agree with the fellow's history, exam, and assessment and plan of care as documented above.    Sydnee Levans, MD

## 2015-12-19 ENCOUNTER — Other Ambulatory Visit: Payer: Self-pay | Admitting: Cardiology

## 2015-12-19 ENCOUNTER — Encounter: Payer: Self-pay | Admitting: Geriatric Medicine

## 2015-12-19 ENCOUNTER — Emergency Department
Admission: EM | Admit: 2015-12-19 | Discharge: 2015-12-19 | Disposition: A | Discharge status: Home / Self Care | Payer: PRIVATE HEALTH INSURANCE | Source: Ambulatory Visit | Attending: Emergency Medicine | Admitting: Emergency Medicine

## 2015-12-19 DIAGNOSIS — I10 Essential (primary) hypertension: Secondary | ICD-10-CM | POA: Insufficient documentation

## 2015-12-19 DIAGNOSIS — Z87891 Personal history of nicotine dependence: Secondary | ICD-10-CM | POA: Insufficient documentation

## 2015-12-19 DIAGNOSIS — G43009 Migraine without aura, not intractable, without status migrainosus: Secondary | ICD-10-CM

## 2015-12-19 LAB — COMPREHENSIVE METABOLIC PANEL
ALT: 18 U/L (ref 0–35)
AST: 24 U/L (ref 0–35)
Albumin: 4.4 g/dL (ref 3.5–5.2)
Alk Phos: 34 U/L — ABNORMAL LOW (ref 35–105)
Anion Gap: 10 (ref 7–16)
Bilirubin,Total: 0.4 mg/dL (ref 0.0–1.2)
CO2: 26 mmol/L (ref 20–28)
Calcium: 9.1 mg/dL (ref 8.8–10.2)
Chloride: 104 mmol/L (ref 96–108)
Creatinine: 0.96 mg/dL — ABNORMAL HIGH (ref 0.51–0.95)
GFR,Black: 83 *
GFR,Caucasian: 72 *
Globulin: 3 g/dL (ref 2.7–4.3)
Glucose: 82 mg/dL (ref 60–99)
Lab: 10 mg/dL (ref 6–20)
Potassium: 4.5 mmol/L (ref 3.3–5.1)
Sodium: 140 mmol/L (ref 133–145)
Total Protein: 7.4 g/dL (ref 6.3–7.7)

## 2015-12-19 LAB — CBC AND DIFFERENTIAL
Baso # K/uL: 0 10*3/uL (ref 0.0–0.1)
Basophil %: 0.4 %
Eos # K/uL: 0.1 10*3/uL (ref 0.0–0.4)
Eosinophil %: 1.1 %
Hematocrit: 42 % (ref 34–45)
Hemoglobin: 13.4 g/dL (ref 11.2–15.7)
IMM Granulocytes #: 0 10*3/uL (ref 0.0–0.1)
IMM Granulocytes: 0.4 %
Lymph # K/uL: 1 10*3/uL — ABNORMAL LOW (ref 1.2–3.7)
Lymphocyte %: 22.1 %
MCH: 28 pg (ref 26–32)
MCHC: 32 g/dL (ref 32–36)
MCV: 87 fL (ref 79–95)
Mono # K/uL: 0.3 10*3/uL (ref 0.2–0.9)
Monocyte %: 7.1 %
Neut # K/uL: 3.1 10*3/uL (ref 1.6–6.1)
Nucl RBC # K/uL: 0 10*3/uL (ref 0.0–0.0)
Nucl RBC %: 0 /100 WBC (ref 0.0–0.2)
Platelets: 108 10*3/uL — ABNORMAL LOW (ref 160–370)
RBC: 4.9 MIL/uL (ref 3.9–5.2)
RDW: 12.5 % (ref 11.7–14.4)
Seg Neut %: 68.9 %
WBC: 4.5 10*3/uL (ref 4.0–10.0)

## 2015-12-19 LAB — TROPONIN T: Troponin T: <0.01 ng/mL (ref 0.00–0.02)

## 2015-12-19 MED ORDER — METOCLOPRAMIDE HCL 5 MG/ML IJ SOLN *I*
10.0000 mg | Freq: Once | INTRAMUSCULAR | Status: AC
Start: 2015-12-19 — End: 2015-12-19
  Administered 2015-12-19: 10 mg via INTRAVENOUS
  Filled 2015-12-19: qty 2

## 2015-12-19 MED ORDER — SODIUM CHLORIDE 0.9 % IV BOLUS *I*
1000.0000 mL | Freq: Once | Status: AC
Start: 2015-12-19 — End: 2015-12-19
  Administered 2015-12-19: 1000 mL via INTRAVENOUS

## 2015-12-19 MED ORDER — DIPHENHYDRAMINE HCL 50 MG/ML IJ SOLN *I*
25.0000 mg | Freq: Once | INTRAMUSCULAR | Status: AC
Start: 2015-12-19 — End: 2015-12-19
  Administered 2015-12-19: 25 mg via INTRAVENOUS
  Filled 2015-12-19: qty 1

## 2015-12-19 NOTE — ED Notes (Signed)
Pt. Verbalized understanding of discharge and followup instructions. PIV removed. Pt. Ambulated with steady gait

## 2015-12-19 NOTE — ED Notes (Signed)
Pt c/o headache for several days . Pt states she has had migraines in the past but this feels different. Pt describes the pain as a throbbing pressure.

## 2015-12-19 NOTE — Discharge Instructions (Signed)
Please follow-up with Neurology.

## 2015-12-19 NOTE — ED Provider Notes (Addendum)
History     Chief Complaint   Patient presents with    Headache     Pt states she has pins and needles in both hands that comes and goes x several days. States she also has a headache and a feeling of chest fullness that began this morning. States she has a history of lupus and antiphospholipid syndrome, but that today "I just feel out of it."Hypertensive in triage.     Tingling    Numbness     HPI Comments: 45 year old female with a history of antiphospholipid antibody syndrome, lupus, recent diagnosis of hypertension who presents to the emergency department with odd sensations in her head, tingling in both of her palms, chest discomfort and back discomfort.  Started when waking this morning with shocks of pain throughout her head is this is similar to previous episodes of migraine.  She does not have pain in her head describes it as in, abnormal sensation.  Noticed about 5 days ago that she had numbness in both of her hands that has been intermittent.  Also with some pressure-like feeling across her upper body as well as her upper back. Is worried that she is having a lupus flair. She just saw hematology 10/18 and had labs that showed positive ANA, DS-DNA showing lupus with some disease activity. Saw Neurology on 06/20/15 and started on Indocin for new diagnosis of migraines. Did not take as she is trying to avoid NSAIDs due to low platelets.  This feels similar to previous headaches although overall she just does not feel well and wanted evaluation.      History provided by:  Patient    Past Medical History:   Diagnosis Date    Antiphospholipid antibody syndrome     Lupus         Past Surgical History:   Procedure Laterality Date    BREAST SURGERY       Family History   Problem Relation Age of Onset    Other Mother        Social History    reports that she quit smoking about 5 years ago. Her smoking use included Cigarettes and E-Cigarette. She has never used smokeless tobacco. She reports that she drinks  about 3.0 oz of alcohol per week  She reports that she does not use illicit drugs. Her sexual activity history is not on file.    Living Situation     Questions Responses    Patient lives with Spouse    Homeless     Caregiver for other family member     External Services     Employment Employed    Domestic Violence Risk           Problem List     Patient Active Problem List   Diagnosis Code    Lupus L93.0    Thrombocytopenia D69.6       Review of Systems   Review of Systems   Constitutional: Positive for appetite change and fatigue. Negative for activity change, chills, diaphoresis, fever and unexpected weight change.   HENT: Negative.    Respiratory: Negative.    Cardiovascular: Positive for chest pain (discomfort).   Gastrointestinal: Negative.    Genitourinary: Negative.    Musculoskeletal: Positive for back pain. Negative for arthralgias, gait problem, joint swelling, myalgias, neck pain and neck stiffness.   Skin: Negative.    Neurological: Positive for dizziness, weakness, numbness and headaches (Described as popping sensations throughout the head). Negative for facial asymmetry and  light-headedness. Speech difficulty: hands bilateral.   Psychiatric/Behavioral: Negative.        Physical Exam     ED Triage Vitals   BP Heart Rate Heart Rate (via Pulse Ox) Resp Temp Temp src SpO2 O2 Device O2 Flow Rate   12/19/15 1155 12/19/15 1155 -- 12/19/15 1155 12/19/15 1155 12/19/15 1155 12/19/15 1155 12/19/15 1155 --   179/99 67  16 36.6 C (97.9 F) TEMPORAL 100 % None (Room air)       Weight           12/19/15 1155           94.8 kg (209 lb)                    Physical Exam   Constitutional: She is oriented to person, place, and time. She appears well-developed and well-nourished.   HENT:   Head: Normocephalic and atraumatic.   Eyes: EOM are normal. Pupils are equal, round, and reactive to light.   Cardiovascular: Normal rate, regular rhythm, normal heart sounds and intact distal pulses.    Pulmonary/Chest: Effort  normal and breath sounds normal. No respiratory distress. She has no wheezes.   Abdominal: Soft. Bowel sounds are normal. She exhibits no distension. There is no tenderness.   Musculoskeletal: Normal range of motion.   Neurological: She is alert and oriented to person, place, and time. She has normal reflexes. No cranial nerve deficit. Coordination normal.   Skin: Skin is warm and dry.   Psychiatric: She has a normal mood and affect. [name]. Judgment and thought content normal.       Medical Decision Making        Initial Evaluation:  ED First Provider Contact     Date/Time Event User Comments    12/19/15 1300 ED First Provider Contact Shuree Brossart A Initial Face to Face Provider Contact          Patient seen by me on arrival date of 12/19/2015 at at time of arrival  11:49 AM.  Initial face to face evaluation time noted above may be discrepant due to patient acuity and delay in documentation.    Assessment:  45 y.o.female comes to the ED with hypertension, headache, chest discomfort.    Differential Diagnosis includes    Hypertensive urgency  Untreated hypertension  Very unlikely to be an acute embolic event as her symptoms are bilateral and non focal although this has been considered.  Dehydration  Electrolyte abnormality  Lupus flare     Plan:     Headache\migraine:  Reglan 10 mg to be infused over 30 minutes  Benadryl 25 mg IV  Hold on NSAIDs  CBC  Normal saline 1 L 1 now    Hypertension/chest discomfort:  EKG  Troponin  BMP    Duncan Dullarolyn Ann Jeanclaude Wentworth, NP      Supervising physician Dr. Johny Drillinghan was immediately available.       Slayden Mennenga, Placido Souarolyn Ann, NP  12/19/15 1441    Addendum:   Improved symptoms with Reglan and benadryl   Home to follow up with Neurology; Migraine      Charles Andringa, Placido Souarolyn Ann, NP  12/19/15 1732

## 2015-12-22 LAB — EKG 12-LEAD
P: -27 deg
P: -28 deg
PR: 147 ms
PR: 148 ms
QRS: 67 deg
QRS: 69 deg
QRSD: 82 ms
QRSD: 86 ms
QT: 460 ms
QT: 468 ms
QTc: 423 ms
QTc: 428 ms
Rate: 49 {beats}/min
Rate: 52 {beats}/min
T: 67 deg
T: 67 deg

## 2016-01-03 ENCOUNTER — Ambulatory Visit: Payer: Self-pay | Admitting: Neurology

## 2016-01-03 ENCOUNTER — Encounter: Payer: Self-pay | Admitting: Neurology

## 2016-01-03 DIAGNOSIS — G43009 Migraine without aura, not intractable, without status migrainosus: Secondary | ICD-10-CM

## 2016-01-03 DIAGNOSIS — R519 Headache, unspecified: Secondary | ICD-10-CM | POA: Insufficient documentation

## 2016-01-03 MED ORDER — METOCLOPRAMIDE HCL 5 MG PO TABS *I*
ORAL_TABLET | ORAL | 2 refills | Status: DC
Start: 2016-01-03 — End: 2016-09-11

## 2016-01-03 NOTE — Patient Instructions (Signed)
stop indomethacin    continue amitriptyline - 25 mg each night    try 5 or 10 mg metoclopramide when get hypersensitivity, etc    can add 1-2 excedrin migraine to that if metoclopramide alone is inadequate    contact us (MyChart) if you want to add imitrex because above inadequate    consider CBT to help with anxiety

## 2016-01-03 NOTE — Progress Notes (Signed)
f/u note  Pt. Name:  Tasha Gonzalez  DOB:   1970/06/05  MRN:   [id number]  PCP:   Waldon Merl, MD    HISTORY:    Tasha Gonzalez was seen in Neurology clinic in follow-up for head pains and migrainous symptoms.  When I met her in May she had been having fleeting sharp head pains over her vertex and behind her left ear several days a week, almost exclusively at work.  Those are infrequent and mild now.  She has been on 25 MG amitriptyline each night (50 MG is too sedating) and amlodipine for blood pressure which might have helped.    In May she had also had 4 or 5 episodes where she felt lightheaded, off balance with tightness in her neck as well as photo and osmophobia with some blurred vision.  Those episodes have continued along with difficulty focusing her attention and are recurring almost every other day, at the end of the day, while at work.  She's had to leave work, or been unable to go to work the next day, about 4 times and on November 1 went to the emergency room with those symptoms along with discomfort and tightness in her chest and low back along with paresthesias in her palms.  There is no real headache to time, she received Reglan and Benadryl and felt much better.    Her other problem has been thrombocytopenia with a history of lupus anticoagulant.  Her laboratory studies showed a potential mild flare with a positive ANA and double-stranded DNA of 8, and a rheumatology reevaluation is pending.  In retrospect her Plaquenil dose was inadvertently halved at some point, perhaps 6 months ago.  She is very anxious about this.      Medications:  Current Outpatient Prescriptions   Medication Sig    metoclopramide (REGLAN) 5 MG tablet 1 po prn other headache medication - can repeat once after 2 hours, maximum 2 doses a day, 6 doses a week    AMLODIPINE BESYLATE PO Take by mouth daily    acetaminophen-codeine (TYLENOL #3) 300-30 MG per tablet Take by mouth every 4-6 hours as needed for Pain    amitriptyline  (ELAVIL) 25 MG tablet Take 1 tablet (25 mg total) by mouth nightly    hydroxychloroquine (PLAQUENIL) 200 MG tablet Take 200 mg by mouth daily     aspirin 81 MG chewable tablet Take 81 mg by mouth daily.    Vitamin D3 (CHOLECALCIFEROL) 1000 UNIT capsule Take 2,000 Units by mouth daily.       Allergy:  Allergies   Allergen Reactions    Toradol [Ketorolac Tromethamine] Hives and Swelling       Physical Exam:     BP 134/83 (BP Location: Right arm, Patient Position: Sitting, Cuff Size: adult)   Pulse 62   Wt 97.1 kg (214 lb)   BMI 33.52 kg/m2     Looks generally well and on neurologic exam mental status is normal with intact concentration, attention and language. Cranial nerves are symmetrically intact including full smooth extraocular movements, normal visual fields and facial movement. Muscle tone nl, no drift, tremor or asterixis, and coordination, Romberg and gait are normal.       IMPRESSION:      ICD-10-CM ICD-9-CM   1. Migraine without aura and without status migrainosus, not intractable G43.009 346.10     Tasha Gonzalez's neurologic exam remains benign and she is having migrainous symptoms without significant head pains now on 25 MG amitriptyline  each night.  She is concerned and anxious about her lupus anticoagulant and thrombocytopenia and realizes that her anxiety might be fueling her symptoms.  She had an excellent response to Benadryl and metoclopramide in the emergency room and therefore were going to try adding that with Excedrin if needed for her migrainous symptoms, and if that's inadequate along with sumatriptan instead.  She is also going to explore possible cognitive behavioral therapy to control her symptoms without medication, and her rheumatology reevaluation is pending.      PLAN:    Continue amitriptyline 25 MG each night    Remain off indomethacin    Try 5-10 MG metoclopramide when necessary migrainous symptoms    Add 1-2 Excedrin Migraine tometoclopramide if metoclopramide alone isn't  adequate    Contact us to try sumatriptan (probably 25 MG) instead of Excedrin if above not working        f/u:  Return in about 3 months (around 04/04/2016) for Next Visit with NP or PA.      Shelda Jakes, MD      cc:  Waldon Merl, MD

## 2016-01-15 ENCOUNTER — Encounter: Payer: Self-pay | Admitting: Rheumatology

## 2016-01-15 ENCOUNTER — Ambulatory Visit: Payer: Self-pay | Admitting: Rheumatology

## 2016-01-15 VITALS — BP 117/87 | HR 60 | Temp 97.9°F | Resp 18 | Ht 67.0 in | Wt 211.0 lb

## 2016-01-15 DIAGNOSIS — Z79899 Other long term (current) drug therapy: Secondary | ICD-10-CM | POA: Insufficient documentation

## 2016-01-15 DIAGNOSIS — I7300 Raynaud's syndrome without gangrene: Secondary | ICD-10-CM

## 2016-01-15 DIAGNOSIS — M3219 Other organ or system involvement in systemic lupus erythematosus: Secondary | ICD-10-CM

## 2016-01-15 DIAGNOSIS — G8929 Other chronic pain: Secondary | ICD-10-CM

## 2016-01-15 DIAGNOSIS — D696 Thrombocytopenia, unspecified: Secondary | ICD-10-CM

## 2016-01-15 DIAGNOSIS — D6869 Other thrombophilia: Secondary | ICD-10-CM | POA: Insufficient documentation

## 2016-01-15 DIAGNOSIS — R519 Headache, unspecified: Secondary | ICD-10-CM

## 2016-01-15 DIAGNOSIS — N96 Recurrent pregnancy loss: Secondary | ICD-10-CM | POA: Insufficient documentation

## 2016-01-15 LAB — URINALYSIS WITH REFLEX TO MICROSCOPIC
Blood,UA: NEGATIVE
Ketones, UA: NEGATIVE
Leuk Esterase,UA: NEGATIVE
Nitrite,UA: NEGATIVE
Protein,UA: NEGATIVE mg/dL
Specific Gravity,UA: 1.023 (ref 1.002–1.030)
pH,UA: 5 (ref 5.0–8.0)

## 2016-01-15 LAB — COMPREHENSIVE METABOLIC PANEL
ALT: 18 U/L (ref 0–35)
AST: 23 U/L (ref 0–35)
Albumin: 4.3 g/dL (ref 3.5–5.2)
Alk Phos: 29 U/L — ABNORMAL LOW (ref 35–105)
Anion Gap: 11 (ref 7–16)
Bilirubin,Total: 0.3 mg/dL (ref 0.0–1.2)
CO2: 28 mmol/L (ref 20–28)
Calcium: 8.9 mg/dL (ref 8.8–10.2)
Chloride: 101 mmol/L (ref 96–108)
Creatinine: 1.16 mg/dL — ABNORMAL HIGH (ref 0.51–0.95)
GFR,Black: 66 *
GFR,Caucasian: 57 * — AB
Glucose: 77 mg/dL (ref 60–99)
Lab: 16 mg/dL (ref 6–20)
Potassium: 4.6 mmol/L (ref 3.3–5.1)
Sodium: 140 mmol/L (ref 133–145)
Total Protein: 7.3 g/dL (ref 6.3–7.7)

## 2016-01-15 LAB — LDL CHOLESTEROL, DIRECT: LDL Direct: 116 mg/dL

## 2016-01-15 LAB — LIPID PANEL
Chol/HDL Ratio: 2.6
Cholesterol: 196 mg/dL
HDL: 75 mg/dL
LDL Calculated: 110 mg/dL
Non HDL Cholesterol: 121 mg/dL
Triglycerides: 54 mg/dL

## 2016-01-15 LAB — CBC AND DIFFERENTIAL
Baso # K/uL: 0 10*3/uL (ref 0.0–0.1)
Basophil %: 0.5 %
Eos # K/uL: 0.1 10*3/uL (ref 0.0–0.4)
Eosinophil %: 2.2 %
Hematocrit: 41 % (ref 34–45)
Hemoglobin: 13.3 g/dL (ref 11.2–15.7)
IMM Granulocytes #: 0 10*3/uL (ref 0.0–0.1)
IMM Granulocytes: 0.5 %
Lymph # K/uL: 1.3 10*3/uL (ref 1.2–3.7)
Lymphocyte %: 31 %
MCH: 29 pg/cell (ref 26–32)
MCHC: 32 g/dL (ref 32–36)
MCV: 88 fL (ref 79–95)
Mono # K/uL: 0.3 10*3/uL (ref 0.2–0.9)
Monocyte %: 7.2 %
Neut # K/uL: 2.4 10*3/uL (ref 1.6–6.1)
Nucl RBC # K/uL: 0 10*3/uL (ref 0.0–0.0)
Nucl RBC %: 0 /100 WBC (ref 0.0–0.2)
Platelets: 45 10*3/uL — ABNORMAL LOW (ref 160–370)
RBC: 4.7 MIL/uL (ref 3.9–5.2)
RDW: 12.7 % (ref 11.7–14.4)
Seg Neut %: 58.6 %
WBC: 4 10*3/uL (ref 4.0–10.0)

## 2016-01-15 LAB — CREATININE, URINE: Creatinine,UR: 160 mg/dL (ref 20–300)

## 2016-01-15 LAB — C4 COMPLEMENT: C4: 11 mg/dL (ref 10–40)

## 2016-01-15 LAB — C3 COMPLEMENT: C3: 68 mg/dL — ABNORMAL LOW (ref 90–180)

## 2016-01-15 LAB — PROTEIN, URINE: Protein,UR: 11 mg/dL (ref 0–11)

## 2016-01-15 LAB — TP/CREATININE RATIO,UR: TP Creatinine ratio,UR: 0.07

## 2016-01-15 MED ORDER — HYDROXYCHLOROQUINE SULFATE 200 MG PO TABS *I*
400.0000 mg | ORAL_TABLET | Freq: Every day | ORAL | 1 refills | Status: DC
Start: 2016-01-15 — End: 2016-11-17

## 2016-01-15 NOTE — Progress Notes (Signed)
[facility] Rheumatology Initial Consultation  Patient: Tasha Gonzalez   DOS: 01/15/2016    Referring Physician: Waldon Merl, MD  Chief complaint/Referral question:  Lupus    HPI:   Tasha Gonzalez is a 45 y.o. female referred to establish care with a new rheumatologist for reported history of lupus.     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 Family history of SLE as well.     G6P2, 4 pregnancy losses, 2 healthy adult children. Lupus diagnosed initially with her first miscarriage workup. Has never had a PE or DVT or stroke in past. On ASA 65m for years. Plaquenil 4073mfor years but more recently 20041maily for past year.     Was seeing Dr. AarLorne Skeensars ago but missed a clinic visit and was discharged from the practice.      Raynaud's in hands and feet but no digital ulcers.     Some diffuse mild arthralgias without associated joint swelling warm or redness.     Headaches bothersome right now - with nausea, photosensitivity, neck pain - sounds like migraine. Seeing neurology for this and not making much progress    Thrombocytopenia for years, some easy bruising and bleeding gums    HTN an issue recently as well and started on Amlodipine for this.     No hair loss, no rashes, no oral or nasal ulcers.     Sister with hx of mild lupus, mother with history of lupus nephritis.     Past Medical/Surgical History:  Past Medical History:   Diagnosis Date    Antiphospholipid antibody syndrome     Anxiety     Bleeding     GERD (gastroesophageal reflux disease)     Hypertension     Lupus     Migraine      Past Surgical History:   Procedure Laterality Date    BREAST SURGERY      CARPAL TUNNEL RELEASE      KNEE CARTILAGE SURGERY Right     OTHER SURGICAL HISTORY         Home Medications:  Prior to Admission medications    Medication Sig Start Date End Date Taking? Authorizing Provider   metoclopramide (REGLAN) 5 MG tablet 1 po prn  other headache medication - can repeat once after 2 hours, maximum 2 doses a day, 6 doses a week 01/03/16   KolShelda JakesD   AMLODIPINE BESYLATE PO Take by mouth daily    [provider]   acetaminophen-codeine (TYLENOL #3) 300-30 MG per tablet Take by mouth every 4-6 hours as needed for Pain    [provider]   amitriptyline (ELAVIL) 25 MG tablet Take 1 tablet (25 mg total) by mouth nightly 06/20/15   VilKnute NeuD   hydroxychloroquine (PLAQUENIL) 200 MG tablet Take 200 mg by mouth daily     [provider]   aspirin 81 MG chewable tablet Take 81 mg by mouth daily.    [provider]   Vitamin D3 (CHOLECALCIFEROL) 1000 UNIT capsule Take 2,000 Units by mouth daily.    [provider]       Allergies:  Updated in EMR    Family history:   Relevant family history noted in HPI. Detailed family history updated in EMR.    Social history:  Relevant social history noted in HPI. Detailed family history updated in EMR.    ROS :   Constitutional: Normal  Musculoskeletal: Joint pain  Skin: Normal  Eyes: Normal  Ears/Nose/Mouth/Throat: Normal  Respiratory: Normal  Cardiac: Normal  Gastrointestinal: Normal  Genitourinary: Normal  Endocrine: Normal  Allergy/Immunologic: Normal  Hematologic: Headaches  Neurologic: Normal  Psychiatric: Normal  Habits: No change  Raynaud's: YES     Physical examination:  BP 117/87   Pulse 60   Temp 36.6 C (97.9 F)   Resp 18   Ht 1.702 m (5' 7" )   Wt 95.7 kg (211 lb)   LMP 01/08/2016 (Approximate)   SpO2 99%   BMI 33.05 kg/m2   General: Well-appearing, alert, and in no acute distress    Psych: Normal affect   Skin: no malar rash, no alopecia, no hypo/hyperpigmentation, no erythema, no nail pitting   Eye: no conjunctival erythema, no eyelid or periorbial edema, pupils equal and round   ENT: moist mucous membranes, no oral lesions, oropharynx clear, normal salivary pooling, ears normal in size, shape, and position, normal dentition   Neck:  supple, trachea midline, no thyromegaly    Lymph:no cervical adenopathy    Lungs:breathing comfortably on room air, lungs clear without wheezes, rhonchi, or rales   IR:SWNIOEV rate and rhythm. No murmurs, rubs, or gallops. Normal S1 and S2.    Abdomen:Soft, non-tender, normoactive bowel sounds    Extremity: equal pulses, no cyanosis, no edema   Neurologic: Alert and oriented, mental status normal    Musculoskeletal:  Complete exam of the bilateral upper extremities demonstrated:     No joint tenderness   No joint warmth or erythema   No joint deformity   Normal active and passive range of motion without pain   No laxity or hypermobility   Normal muscle tone and strength    No joint swelling or synoviits  Complete exam of the bilateral lower extremities demonstrated:    No joint tenderness   No joint warmth or erythema   No joint deformity   Normal active and passive range of motion without pain   No laxity or hypermobility   Normal muscle tone and strength    No joint swelling or synoviits  Complete exam of the axial musculoskeletal system (including head, neck, spine, ribs, pelvis) demonstrated:    Not examined      Relevant labs:   Heme       Lab results: 12/19/15  1514   WBC 4.5   Hemoglobin 13.4   Hematocrit 42   RBC 4.9   Platelets 108*         Chem       Lab results: 12/19/15  1514   Sodium 140   Potassium 4.5   Chloride 104   CO2 26   UN 10   Creatinine 0.96*   GFR,Caucasian 72   GFR,Black 83   Glucose 82   Calcium 9.1   Total Protein 7.4   Albumin 4.4   ALT 18   AST 24   Alk Phos 34*   Bilirubin,Total 0.4         Urine  No results for input(s): UCOL, USG, UOH, ULEU, UNIT, UPRO, UBLD, KETONESU, GLUCOSEU, UWBC, URBC, UTPR, CREAU, UCRNC in the last 8760 hours.     Inflammatory Markers  No results found for: CRP  No results found for: ESR      Lab results: 12/19/15  1514   Platelets 108*       Ferritin   Date Value Ref Range Status   12/05/2015 67 10 - 120 ng/mL Final  Rheum  labs:  Lab Results   Component Value Date    ANAS POS (!) 12/05/2015    ANAT 640 (!) 12/05/2015    ANAP HOMOGENEOUS 12/05/2015    DNAN 8 (H) 12/05/2015    CCP 4 12/05/2015    ACLIG >150 (H) 12/05/2015    ACLIM 28 (H) 12/05/2015    LUPR POS (!) 12/05/2015        Prior relevant [facility] imaging studies reviewed:   No results found.    Impression and recommendations:  Tasha Gonzalez is a 45 y.o. female seeking to establish care with a new rheumatologist for her history of systemic lupus and secondary antiphospholipid antibody syndrome.  She's had recurrent miscarriages 4 in the past a very elevated anticardiolipin IgG and IgM as well as a positive lupus anticoagulant and a positive ANA test at a titer of 1-640 most recently an indeterminate level to dsDNA antibodies.  She's had some diffuse arthralgias, chronic thrombocytopenia and more recently what sounds like migraine headaches, Raynaud's without any history of digital ischemia, as far as symptoms.  No clear skin manifestations, no clear other end organ involvement from the systemic lupus.    I think her chronic thrombocytopenia is likely secondary to antiphospholipid antibody syndrome with antiphospholipid antibodies binding her platelets and causing the thrombus cytopenia.  Thankfully she is not having any significant bleeding issues and I do not think we should change her current treatment a cause of the thrombocytopenia alone.  I encouraged her to continue the low-dose aspirin 81 mg daily.    Unclear what is causing her headaches that sound like migraine.  Patients with antiphospholipid antibody syndrome and lupus are at higher risk for neuropsychiatric lupus and reasonable to try and assess for this today.  We'll send some further serologic studies such as RA polymerase 3 antibody but would also like to obtain some baseline imaging with an MRI of her brain.    We also discussed her increased risk of ischemic coronary heart disease given her long history of  lupus.  We will check her cholesterol and lipids today.  She may benefit from initiation of a statin.    I would also like her to increase her hydroxychloroquine dose back to 400 mg daily and she will see an ophthalmologist for retinal screening exam sometime in the next few months.      ICD-10-CM ICD-9-CM   1. Other systemic lupus erythematosus with other organ involvement M32.19 710.0   2. Secondary antiphospholipid antibody syndrome D68.69 289.82   3. Thrombocytopenia D69.6 287.5   4. History of recurrent miscarriages, not currently pregnant N96 629.81   5. Raynaud's phenomenon without gangrene I73.00 443.0   6. Chronic nonintractable headache, unspecified headache type R51 784.0   7. Long-term use of Plaquenil Z79.899 V58.69     There are no Patient Instructions on file for this visit.    Tasha Kitchens, MD , Rheumatology Attending, 01/15/2016

## 2016-01-16 ENCOUNTER — Telehealth: Payer: Self-pay | Admitting: Rheumatology

## 2016-01-16 LAB — ANTI-SSA/SSB
Anti-LA/SS-B: <0.2 AI (ref 0.0–0.9)
Anti-RO/SS-A: 0.4 AI (ref 0.0–0.9)

## 2016-01-16 LAB — ANTI RNP/SMITH
Anti-RNP: 0.7 AI (ref 0.0–0.9)
Anti-Smith: <0.2 AI (ref 0.0–0.9)
SM/RNP AB: <0.2 AI (ref 0.0–0.9)

## 2016-01-16 MED ORDER — METHYLPREDNISOLONE 4 MG PO TABS *I*
16.0000 mg | ORAL_TABLET | Freq: Every day | ORAL | 0 refills | Status: DC
Start: 2016-01-16 — End: 2016-02-13

## 2016-01-16 NOTE — Telephone Encounter (Signed)
Writer advised of message below via identified VM and advised the patient to return our call with any questions.

## 2016-01-16 NOTE — Telephone Encounter (Signed)
Patient's platelet count has dropped a bit and her complements are a bit low as well.  We had her increase her hydroxychloroquine dose to 400 mg at her initial visit with me this week.  I would however also like her to start Medrol 16 mg oral daily for the next few weeks and a repeat labs in about 2 weeks.    Please call the patient with the above.

## 2016-01-18 LAB — BETA-2 GLYCOPROTEIN ANTIBODIES
Beta-2 Glyco 1 IgG: 95 SGU — ABNORMAL HIGH (ref 0–20)
Beta-2 Glyco 1 IgM: 58 SMU — ABNORMAL HIGH (ref 0–20)

## 2016-01-18 LAB — HISTONE AB: Histone Ab: 2.1 Units — ABNORMAL HIGH (ref 0.0–0.9)

## 2016-01-20 LAB — RNA POLYMERASE 3 IGG AB: RNA Polymerase 3 IgG: 28 Units — ABNORMAL HIGH (ref 0–19)

## 2016-01-21 ENCOUNTER — Other Ambulatory Visit: Payer: Self-pay | Admitting: Hematology and Oncology

## 2016-01-21 ENCOUNTER — Encounter: Payer: Self-pay | Admitting: Hematology and Oncology

## 2016-01-21 DIAGNOSIS — D696 Thrombocytopenia, unspecified: Secondary | ICD-10-CM

## 2016-01-23 ENCOUNTER — Ambulatory Visit: Payer: Self-pay

## 2016-01-24 ENCOUNTER — Other Ambulatory Visit
Admission: RE | Admit: 2016-01-24 | Discharge: 2016-01-24 | Disposition: A | Discharge status: Home / Self Care | Payer: Self-pay | Source: Ambulatory Visit | Attending: Gastroenterology | Admitting: Gastroenterology

## 2016-01-24 DIAGNOSIS — D696 Thrombocytopenia, unspecified: Secondary | ICD-10-CM

## 2016-01-24 LAB — CBC AND DIFFERENTIAL
Baso # K/uL: 0 10*3/uL (ref 0.0–0.1)
Basophil %: 0.5 %
Eos # K/uL: 0.1 10*3/uL (ref 0.0–0.4)
Eosinophil %: 1.1 %
Hematocrit: 42 % (ref 34–45)
Hemoglobin: 13.4 g/dL (ref 11.2–15.7)
IMM Granulocytes #: 0 10*3/uL (ref 0.0–0.1)
IMM Granulocytes: 0.4 %
Lymph # K/uL: 2 10*3/uL (ref 1.2–3.7)
Lymphocyte %: 35 %
MCH: 28 pg/cell (ref 26–32)
MCHC: 32 g/dL (ref 32–36)
MCV: 88 fL (ref 79–95)
Mono # K/uL: 0.6 10*3/uL (ref 0.2–0.9)
Monocyte %: 10.4 %
Neut # K/uL: 3 10*3/uL (ref 1.6–6.1)
Nucl RBC # K/uL: 0 10*3/uL (ref 0.0–0.0)
Nucl RBC %: 0 /100 WBC (ref 0.0–0.2)
Platelets: 53 10*3/uL — ABNORMAL LOW (ref 160–370)
RBC: 4.8 MIL/uL (ref 3.9–5.2)
RDW: 12.5 % (ref 11.7–14.4)
Seg Neut %: 52.6 %
WBC: 5.7 10*3/uL (ref 4.0–10.0)

## 2016-01-24 LAB — DUPLICATE SLIDE: Slide Sent To: 114

## 2016-01-25 ENCOUNTER — Telehealth: Payer: Self-pay

## 2016-01-25 ENCOUNTER — Telehealth: Payer: Self-pay | Admitting: Rheumatology

## 2016-01-25 NOTE — Telephone Encounter (Signed)
Contacted patient, leaving a message for them to call back in regards to: Unable to get through to Pt's insurance to secure a PA [address] on 01/28/16 at 9:30am.  Please transfer to [phone] if patient calls in through Access.  Many thanks!

## 2016-01-25 NOTE — Telephone Encounter (Signed)
[facility], reporting that the patient's 12/11 scheduled MRI, requires a prior authorization.     If necessary, Crystal can be reached at [phone].

## 2016-01-30 NOTE — Telephone Encounter (Signed)
Approved and completed.

## 2016-02-06 ENCOUNTER — Ambulatory Visit
Admission: RE | Admit: 2016-02-06 | Discharge: 2016-02-06 | Disposition: A | Discharge status: Home / Self Care | Payer: Self-pay | Source: Ambulatory Visit | Attending: Rheumatology | Admitting: Rheumatology

## 2016-02-13 ENCOUNTER — Other Ambulatory Visit: Payer: Self-pay | Admitting: Rheumatology

## 2016-02-13 NOTE — Telephone Encounter (Signed)
AIR provider: Kizzie FurnishPalma    Medication refill RUE:[id number]:medrol    (Be sure to confirm that correct pharmacy is chosen prior to sending to provider)    Last AIR appointment: 01/15/16    Next AIR appointment: 03/18/16    Last labs: n/a    PPD/TBAG: N/A

## 2016-03-17 ENCOUNTER — Other Ambulatory Visit: Payer: Self-pay | Admitting: Rheumatology

## 2016-03-17 NOTE — Telephone Encounter (Signed)
Last FUV: 01/15/16  Upcoming FUV: 03/18/16

## 2016-03-18 ENCOUNTER — Ambulatory Visit: Payer: Self-pay | Admitting: Rheumatology

## 2016-04-07 ENCOUNTER — Other Ambulatory Visit: Payer: Self-pay | Admitting: Neurology

## 2016-04-07 NOTE — Telephone Encounter (Signed)
[facility] is requesting refill for patient's Amitriptyline 25 mg tablet prescription, which was last filled on 06/20/15 with 5 refills. Patient was last seen on 01/03/16 by Dr.Kolkin. Routing to Neurology Nurse Pool for review.  Pharmacy is requesting a 90 day supply.

## 2016-04-08 MED ORDER — AMITRIPTYLINE HCL 25 MG PO TABS *I*
25.0000 mg | ORAL_TABLET | Freq: Every evening | ORAL | 2 refills | Status: DC
Start: 2016-04-08 — End: 2016-09-11

## 2016-04-18 ENCOUNTER — Other Ambulatory Visit: Payer: Self-pay | Admitting: Rheumatology

## 2016-04-18 NOTE — Telephone Encounter (Signed)
Last FUV 01/15/16    No upcoming FUV    Received fax from pharmacy stating "patient would like 90 day supply" Please advise

## 2016-04-21 MED ORDER — METHYLPREDNISOLONE 4 MG PO TABS *I*
16.0000 mg | ORAL_TABLET | Freq: Every day | ORAL | 0 refills | Status: DC
Start: 2016-04-21 — End: 2016-05-16

## 2016-04-21 NOTE — Telephone Encounter (Signed)
She needs to have lupus monitoring labs done and needs to attend her Artis DelayFUV - she missed her last in Jan 2018.     I have provided a single 90 day refill    If she does not come to her FUV I will not be able to provide any more medications for her    Please call and provide clear instructions that she needs to have labs done and needs to attend her FUV if we are to continue prescribing her medications.     Ragan Duhon Holterhristopher R Kapena Hamme, MD , Rheumatology Attending, 04/21/2016

## 2016-04-21 NOTE — Telephone Encounter (Signed)
Attempted to contact patient. Number on file is temporarily out of service. Will send patient a MyChart message.

## 2016-05-16 ENCOUNTER — Telehealth: Payer: Self-pay | Admitting: Rheumatology

## 2016-05-16 DIAGNOSIS — M329 Systemic lupus erythematosus, unspecified: Secondary | ICD-10-CM

## 2016-05-16 MED ORDER — PREDNISONE 5 MG PO TABS *I*
15.0000 mg | ORAL_TABLET | Freq: Every day | ORAL | 0 refills | Status: AC
Start: 2016-05-16 — End: 2016-06-15

## 2016-05-16 NOTE — Telephone Encounter (Signed)
Attempted to call, line busy. 

## 2016-05-16 NOTE — Telephone Encounter (Signed)
Please advise on labs that pt should have drawn.     Writer attempted to call pt to relay Dr. Vernie Murders message. No answer. Left message for pt to call office back. Thank you!

## 2016-05-16 NOTE — Telephone Encounter (Signed)
Please see encounter and advise. Thank you!

## 2016-05-16 NOTE — Telephone Encounter (Signed)
Tasha Gonzalez called to check if there's cheaper alternatives to methylPREDNISolone (MEDROL) 4 MG tablet.  She mentioned that she would have to pay $400 out of pocket and is running out of medication.  She can be reached at her cell [phone] or work number [phone]. Thank you!

## 2016-05-16 NOTE — Telephone Encounter (Signed)
Left detailed message explaining switch to prednisone and standing lab orders

## 2016-05-16 NOTE — Telephone Encounter (Signed)
I placed standing lupus labs    Thanks  Tama Grosz Holter, MD , Rheumatology Attending, 05/16/2016

## 2016-05-16 NOTE — Telephone Encounter (Signed)
She can switch the medrol to prednisone  oral daily  She needs labs ASAP  She needs to schedule and attend a FUV with me soon as well.     Maliki Gignac Holter, MD , Rheumatology Attending, 05/16/2016

## 2016-08-08 ENCOUNTER — Encounter: Payer: Self-pay | Admitting: Hematology

## 2016-08-08 ENCOUNTER — Telehealth: Payer: Self-pay | Admitting: Hematology

## 2016-08-08 ENCOUNTER — Encounter: Payer: Self-pay | Admitting: Gastroenterology

## 2016-08-08 ENCOUNTER — Telehealth: Payer: Self-pay | Admitting: Rheumatology

## 2016-08-08 ENCOUNTER — Telehealth: Payer: Self-pay | Admitting: Neurology

## 2016-08-08 ENCOUNTER — Encounter: Payer: Self-pay | Admitting: Rheumatology

## 2016-08-08 NOTE — Telephone Encounter (Signed)
Letter faxed and confirmed to [phone]

## 2016-08-08 NOTE — Telephone Encounter (Signed)
Patient calling, stated she needs a letter ASAP stating her diagnosis and the medications Dr. Delsa BernKolkin has prescribe for her. Letter can be fax to [phone]. She is also requesting a call for confirmation.

## 2016-08-08 NOTE — Telephone Encounter (Signed)
Patient is calling to have letter faxed to her stating her diagnosis and what medications were prescribed to her.    Fax to [phone].    If any questions, she can be reached at [phone]

## 2016-08-11 NOTE — Telephone Encounter (Signed)
Patient is scheduled   

## 2016-08-11 NOTE — Telephone Encounter (Signed)
I wrote the letter, but she is very overdue for a FUV (was supposed to be in Jan/Feb of 2018). That needs to be scheduled.

## 2016-08-13 ENCOUNTER — Ambulatory Visit: Payer: Self-pay | Admitting: Neurology

## 2016-08-14 NOTE — Telephone Encounter (Signed)
Letter drafted and faxed to provided number.

## 2016-09-11 ENCOUNTER — Other Ambulatory Visit: Payer: Self-pay | Admitting: Neurology

## 2016-09-11 MED ORDER — AMITRIPTYLINE HCL 25 MG PO TABS *I*
25.0000 mg | ORAL_TABLET | Freq: Every evening | ORAL | 2 refills | Status: AC
Start: 2016-09-11 — End: ?

## 2016-09-11 MED ORDER — METOCLOPRAMIDE HCL 5 MG PO TABS *I*
ORAL_TABLET | ORAL | 2 refills | Status: AC
Start: 2016-09-11 — End: ?

## 2016-09-12 ENCOUNTER — Other Ambulatory Visit: Payer: Self-pay | Admitting: Rheumatology

## 2016-09-12 NOTE — Telephone Encounter (Signed)
She has not done labs as requested and she has not scheduled a FUV as requested    No more refills until she has labs and schedules a FUV

## 2016-09-12 NOTE — Telephone Encounter (Signed)
AIR provider: Kizzie FurnishPalma    Medication refill [id number]:Plaquenil (hydroxychloroquine)-yearly eye exam    (Be sure to confirm that correct pharmacy is chosen prior to sending to provider)    Last AIR appointment: 01/15/16    Next AIR appointment: needs to be scheduled (route to scheduler)    Last labs: n/a    PPD/TBAG: N/A    Eye exam date: none         Writer sent a mychart message to the patient to remind her to get her annual eye exam done for her plaquenil refill.

## 2016-09-12 NOTE — Telephone Encounter (Signed)
We received a fax for a refill for methylprednisolone for this patient. This medication is not on his medication list. Please advise.

## 2016-09-15 NOTE — Telephone Encounter (Signed)
Contacted patient, leaving a message for them to call back in regards to: relay provider message

## 2016-09-17 ENCOUNTER — Other Ambulatory Visit: Payer: Self-pay | Admitting: Rheumatology

## 2016-09-17 ENCOUNTER — Ambulatory Visit: Payer: Self-pay | Admitting: Neurology

## 2016-09-17 NOTE — Telephone Encounter (Signed)
We received a fax from [facility] for a refill for methylprednisonlone. This medication is not on the patient's medication list. Please advise.

## 2016-09-17 NOTE — Telephone Encounter (Signed)
Contacted patient, leaving a message for them to call back in regards to: relaying provider message. Will send a message via MyChart

## 2016-09-18 ENCOUNTER — Encounter: Payer: Self-pay | Admitting: Neurology

## 2016-09-22 MED ORDER — PREDNISONE 5 MG PO TABS *I*
15.0000 mg | ORAL_TABLET | Freq: Every day | ORAL | 0 refills | Status: AC
Start: 2016-09-22 — End: 2016-10-22

## 2016-09-22 NOTE — Telephone Encounter (Signed)
She can use medrol instead but it seems like we had issues with high out of pocket costs for this in the past

## 2016-09-22 NOTE — Telephone Encounter (Signed)
Writer called pt and relayed Dr Vernie MurdersPalma's messages. Pt verbalizes understanding.     Pt states that she has Medrol at home and is asking if she can do a taper with that instead of Prednisone.      Pt states that she doesn't do well "I'm not allergic to Prednisone, but it causes her to have hot flashes and retain fluids - uncomfortable".     Pt states that the reason we got the request for refill is due to switching [facility].     Writer advised pt that she had lab requisites in the system and can go [facility] lab to have them drawn.     Writer advised [facility] would be [facility] and and the clerical staff  ; pt verbalizes understanding and denies further questions/concerns.

## 2016-09-22 NOTE — Telephone Encounter (Signed)
Please advise on Medrol taper schedule. Thanks!

## 2016-09-22 NOTE — Telephone Encounter (Signed)
I send in a taper of prednisone 15mg  daily for 1 week - taper by 5mg  per week until off

## 2016-09-22 NOTE — Telephone Encounter (Signed)
She needs to schedule a FUV before I am willing to refill any other medications for her  She also needs to have her lupus labs done

## 2016-09-22 NOTE — Addendum Note (Signed)
Addended by: Vinaya Sancho HolterPALMA, Hevin Jeffcoat R on: 09/22/2016 09:34 AM     Modules accepted: Orders

## 2016-09-23 NOTE — Telephone Encounter (Signed)
Writer attempted to call pt to relay message. No answer. Left message for call back/check MyChart messages.     MyChart message sent. Thank you!

## 2016-09-23 NOTE — Telephone Encounter (Signed)
Medrol 16mg  daily - reduce dose by 4mg  every week until off

## 2016-10-09 ENCOUNTER — Telehealth: Payer: Self-pay | Admitting: Rheumatology

## 2016-10-09 NOTE — Telephone Encounter (Signed)
[facility] pharmacy calling to request prescription(s) "methylprednisonlone"to be sent to the following Pharmacy [facility] [place], [place] - [address] Medication is not on patient medication list.     Patient can be reached at

## 2016-10-09 NOTE — Telephone Encounter (Signed)
Writer called pt to discuss, as Clinical research associate notes that prior Medrol prescription was cancelled and that pt has been prescribed Prednisone. Writer asked that pt return call and advise if she is taking Medrol or Prednisone and if a refill is needed to be sent to [facility]. Writer advised that refill would not be sent until return call was received. Phone number provided.

## 2016-11-07 ENCOUNTER — Ambulatory Visit: Payer: Self-pay | Admitting: Neurology

## 2016-11-10 ENCOUNTER — Encounter: Payer: Self-pay | Admitting: Neurology

## 2016-11-17 ENCOUNTER — Other Ambulatory Visit: Payer: Self-pay | Admitting: Rheumatology

## 2016-11-17 MED ORDER — HYDROXYCHLOROQUINE SULFATE 200 MG PO TABS *I*
400.0000 mg | ORAL_TABLET | Freq: Every day | ORAL | 1 refills | Status: AC
Start: 2016-11-17 — End: 2017-02-15

## 2016-11-17 NOTE — Telephone Encounter (Signed)
Patient's last FUV was 01-15-16. Last eye exam could not be found.     Patient needs to be contacted for FUV. Thanks.

## 2016-12-09 ENCOUNTER — Telehealth: Payer: Self-pay

## 2016-12-09 NOTE — Telephone Encounter (Signed)
[facility] - Ambulatory Telephone Intake Screen     Patient Information:    Patient's Name: Tasha Gonzalez  Patient's Date of Birth: 1970-05-07  Patient's Medical Record Number: [id number]  Patient's Home Phone: There is no home phone number on file.  Patient's Work Phone: [phone] (work)  Information systems manageratient's Mobile Phone:   Telephone Information:   Mobile [phone]     Patient's Marital Status (if applicable): Married  Can a message be left? yes      Insurance Information:    [employer]  Policy number:       Referral Source:  Referral Source:       Presenting Concern:     What is the reason you are seeking care?     (Please provide a brief description of the reasons the patient or referring provider is seeking mental health treatment at this time.  Please use patients own words where possible).    Depression and a major life event and difficult to cope.  No meds.  [phone] work phone number                                    Mental Health History:    Are you currently involved in mental health treatment?  No       Are you currently taking a long acting injectable medication?  No    Will you need an injection at the time of your first intake assessment appointment?   no      Customer Service:    Do you need arrangements for?    Wheelchair: No    Interpreter Services:No

## 2016-12-12 ENCOUNTER — Ambulatory Visit: Payer: BLUE CROSS/BLUE SHIELD | Attending: Psychiatry

## 2016-12-12 ENCOUNTER — Ambulatory Visit: Payer: BLUE CROSS/BLUE SHIELD

## 2016-12-12 DIAGNOSIS — F419 Anxiety disorder, unspecified: Secondary | ICD-10-CM
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
## 2016-12-15 ENCOUNTER — Telehealth: Payer: Self-pay

## 2016-12-15 NOTE — Telephone Encounter (Signed)
Clinical Management Note     Writer attempted to call PT in regards to a transition appointment. A VM was left requesting a call back. Writer to attempt again 12/16/2016

## 2016-12-16 ENCOUNTER — Telehealth: Payer: Self-pay

## 2016-12-16 NOTE — Telephone Encounter (Signed)
Clinical Management Note     Writer called requesting call back in regards to scheduling transition.

## 2016-12-17 NOTE — Progress Notes (Signed)
Clinical Management Note     PT returned writer's call. Writer scheduled PT for 12/26/2016 at 1 pm with Franco Colletebecca Holmes. This message will be routed to the call center for scheduling.    In addition, flowsheet was incomplete and Clinical research associatewriter has entered data correctly. Date will read for 10/31 whoever the visit was on 10/30

## 2016-12-26 ENCOUNTER — Telehealth: Payer: Self-pay | Admitting: Social Worker

## 2016-12-26 ENCOUNTER — Ambulatory Visit: Payer: BLUE CROSS/BLUE SHIELD | Admitting: Social Worker

## 2016-12-26 NOTE — Telephone Encounter (Signed)
[facility] MISSED/CANCELLED APPOINTMENT     Name: Tasha IsaacsCHRYSTINE Risk  MRN: [id number]   DOB: 1970-12-29    Date of Scheduled Service: 12/26/2016    Ms. Tasha Gonzalez was a no show for today's appointment.  I have attempted to contact the pt via phone, however the number was unable to recieve calls.    Additional Information:    Client is currently NOT on the CQI pathway.

## 2016-12-29 ENCOUNTER — Encounter: Payer: Self-pay | Admitting: Social Worker

## 2016-12-29 NOTE — Progress Notes (Signed)
Writer mailed outreach letter to pt regarding missed appt on 12/26/2016 with instructions on how to r/s appt.

## 2017-01-05 ENCOUNTER — Ambulatory Visit: Payer: BLUE CROSS/BLUE SHIELD | Attending: Psychiatry | Admitting: Social Worker

## 2017-01-05 DIAGNOSIS — F609 Personality disorder, unspecified: Secondary | ICD-10-CM

## 2017-01-05 DIAGNOSIS — F4323 Adjustment disorder with mixed anxiety and depressed mood: Secondary | ICD-10-CM | POA: Insufficient documentation

## 2017-01-05 NOTE — Progress Notes (Signed)
Behavioral Health Psychotherapy Visit Subsequent to Initial Diagnostic Assessment     LENGTH OF SESSION: 60 minutes    Contact Type:  Location: On-Site    Face to Face, Individual Psychotherapy    Update of HPI since Initial Visit:  Nishka Norva RiffleMartins continues to participate in an assessment of symptoms of anxiety and depression to assist with clinical decision making and treatment planning.    Patient symptoms and psychosocial stressors reviewed: legal stressors, work, family relationships, personal values and belief system, difficulty with coping skills, symptoms of irritability, increased worry, agitation    Session Content::  Client presented to follow up session following initial intake assessment. Client further discusses current stressors. Client reports ongoing struggle with fears related to her legal case. Client reports that she is unsure whether symptoms are "normal" in reaction to stressors or if she has a mental health condition. Reports prior to these legal troubles she never felt she needed mental health treatment or had "anything wrong with her". Client does present with personality rigidity and concrete thinking especially in regards to problem-solving and considering potential solutions. Client does present with some unhelpful thinking patterns which lead to perfectionist qualities and difficulty identifying emotions or being vulnerable. Writer asked client to consider what she would like to change in her life or how symptoms may be negatively impacting her life. Client was able to identify that she struggles with anger - lashing out at family at times as well as using alcohol to cope when she is having a rough day. Writer assisted pt with re-framing treatment as a way for her to gain extra support in coping with current stressors instead of self-labeling that she is weak or has a mental health problem. Client was receptive to this. Writer and client utilized remainder of session to further explore  scope and context of current stressors. Agreed to continue discussing symptoms to gain insight of triggers at next session. Writer will also continue to build rapport and therapeutic alliance.     Interventions:  Cognitive Behavioral Therapy skills (specifiy skills used):  reframing, underlying beliefs  Continued to develop therapeutic relationship  Problem Solving Therapy skills  Provided therapeutic support during discussion of distressing/traumatic events and/or symptoms  Treatment Planning    Mental Status Exam:  APPEARANCE: Appears stated age  ATTITUDE TOWARD INTERVIEWER: Cooperative and Entitled  MOTOR ACTIVITY: WNL (within normal limits)  EYE CONTACT: Direct  SPEECH: Normal rate and tone  AFFECT: Full Range  MOOD: Anxious, Depressed and Dramatic  THOUGHT PROCESS: Concrete  THOUGHT CONTENT: Negative Rumination and Preoccupations  PERCEPTION: Within normal limits  CURRENT SUICIDAL IDEATION: patient denies  CURRENT HOMICIDAL IDEATION: Patient denies  ORIENTATION: Alert and Oriented X 3.  CONCENTRATION: WNL  MEMORY:   Recent: intact   Remote: intact  COGNITIVE FUNCTION: Above Average intelligence  JUDGMENT: Intact  IMPULSE CONTROL: Good  INSIGHT: Fair    Risk Assessment:  ASSESSMENT OF RISK FOR SUICIDAL BEHAVIOR  Changes in risk for suicide from baseline Formulation of Risk and/or previous intake, including newly identified risk, if any: none  Violence risk was assessed and No Change noted from baseline formulation of risk and/or previous assessment.    Working Diagnosis:      ICD-10-CM ICD-9-CM   1. Situational mixed anxiety and depressive disorder F43.23 309.28   2. 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 Client presents with difficulty coping with current stressors and  would benefit from CBT, DBT, and psychotherapy to increase insight of symptoms and development of healthy coping skills. Client will be further assessed for differential diagnosis of OCD, anxiety disorder, mood disorder, PTSD, and/or cluster B personality disorder. Client reports alcohol use which may indicate further assessment of substance use disoder.Client denies si and hi and does not present with symptoms of psychosis, thought disorder, or bipolar mood disorder.      Plan:  Patient will participate in continued assessment of anxiety and depressive sx to assist with clinical decision making and treatment planning.    NEXT APPT: 01/22/17      Franco Colletebecca Belma Dyches, LMSW

## 2017-01-22 ENCOUNTER — Ambulatory Visit: Payer: BLUE CROSS/BLUE SHIELD | Admitting: Social Worker

## 2017-01-26 ENCOUNTER — Telehealth: Payer: Self-pay | Admitting: Social Worker

## 2017-01-26 NOTE — Telephone Encounter (Signed)
[facility] MISSED/CANCELLED APPOINTMENT     Name: Tasha Gonzalez  MRN: [id number]   DOB: [birth year]    Date of Scheduled Service: 01/22/2017    Ms. Tasha Gonzalez was a no show for her appointment on 01/22/2017 with Franco Colletebecca Nekia Maxham, LMSW.  I have left the patient a message to contact me and sent the patient a letter regarding her missed appointment.    Additional Information:    Writer notified pt that if she does not make contact by 02/09/2017 (two weeks notice) that her current ep of care will be closed.

## 2017-02-12 ENCOUNTER — Encounter: Payer: Self-pay | Admitting: Social Worker

## 2017-02-12 NOTE — Progress Notes (Signed)
Clinical Management Note     Client will be non-admitted at this time as she has missed her last appointment and not responded to outreach attempts. She is self-referred and will be notified via mail. She attended 1 appointment with writer and no showed her last appt on 01/22/2017. She may return to intake should she require in the future.

## 2018-04-01 NOTE — Progress Notes (Signed)
2/13 No notes received by office.  Care everywhere section of records references treatment at [facility] but no treatment notes are available

## 2019-05-13 ENCOUNTER — Ambulatory Visit: Payer: Self-pay | Attending: Internal Medicine

## 2019-05-13 DIAGNOSIS — Z23 Encounter for immunization: Secondary | ICD-10-CM

## 2019-05-13 NOTE — Progress Notes (Signed)
   Covid-19 Vaccination Clinic  Name:  Jody Ramos    MRN: [id number] DOB: 11/23/1970  05/13/2019  [name] was observed post Covid-19 immunization for 30 minutes based on pre-vaccination screening without incident. She was provided with Vaccine Information Sheet and instruction to access the V-Safe system.   [name] was instructed to call 911 with any severe reactions post vaccine: Marland Kitchen Difficulty breathing  . Swelling of face and throat  . A fast heartbeat  . A bad rash all over body  . Dizziness and weakness   Immunizations Administered    Name Date Dose VIS Date Route   Moderna COVID-19 Vaccine 05/13/2019 12:16 PM 0.5 mL 01/18/2019 Intramuscular   Manufacturer: Moderna   Lot: [id number]   NDC: 43014-840-39

## 2019-06-15 ENCOUNTER — Ambulatory Visit: Payer: Self-pay | Attending: Internal Medicine

## 2019-06-15 DIAGNOSIS — Z23 Encounter for immunization: Secondary | ICD-10-CM

## 2019-06-15 NOTE — Progress Notes (Signed)
   Covid-19 Vaccination Clinic  Name:  Jody Ramos    MRN: [id number] DOB: 1970/09/08  06/15/2019  Jody Ramos was observed post Covid-19 immunization for 15 minutes without incident. She was provided with Vaccine Information Sheet and instruction to access the V-Safe system.   Jody Ramos was instructed to call 911 with any severe reactions post vaccine: Marland Kitchen Difficulty breathing  . Swelling of face and throat  . A fast heartbeat  . A bad rash all over body  . Dizziness and weakness   Immunizations Administered    Name Date Dose VIS Date Route   Moderna COVID-19 Vaccine 06/15/2019 10:21 AM 0.5 mL 01/2019 Intramuscular   Manufacturer: Moderna   Lot: [id number]   NDC: 96759-163-84

## 2020-04-25 ENCOUNTER — Encounter (HOSPITAL_COMMUNITY): Payer: Self-pay

## 2020-04-25 ENCOUNTER — Emergency Department (HOSPITAL_COMMUNITY): Payer: Self-pay

## 2020-04-25 ENCOUNTER — Other Ambulatory Visit: Payer: Self-pay

## 2020-04-25 ENCOUNTER — Emergency Department (HOSPITAL_COMMUNITY)
Admission: EM | Admit: 2020-04-25 | Discharge: 2020-04-26 | Disposition: A | Discharge status: Home / Self Care | Payer: Self-pay | Attending: Emergency Medicine | Admitting: Emergency Medicine

## 2020-04-25 DIAGNOSIS — M542 Cervicalgia: Secondary | ICD-10-CM

## 2020-04-25 DIAGNOSIS — M79601 Pain in right arm: Secondary | ICD-10-CM

## 2020-04-25 DIAGNOSIS — Z87891 Personal history of nicotine dependence: Secondary | ICD-10-CM | POA: Insufficient documentation

## 2020-04-25 HISTORY — DX: Thrombocytosis, unspecified: D75.839

## 2020-04-25 HISTORY — DX: Systemic lupus erythematosus, unspecified: M32.9

## 2020-04-25 HISTORY — DX: Reserved for concepts with insufficient information to code with codable children: IMO0002

## 2020-04-25 LAB — CBC
HCT: 41.9 % (ref 36.0–46.0)
Hemoglobin: 14.2 g/dL (ref 12.0–15.0)
MCH: 30.3 pg (ref 26.0–34.0)
MCHC: 33.9 g/dL (ref 30.0–36.0)
MCV: 89.5 fL (ref 80.0–100.0)
Platelets: UNDETERMINED 10*3/uL (ref 150–400)
RBC: 4.68 MIL/uL (ref 3.87–5.11)
RDW: 12.3 % (ref 11.5–15.5)
WBC: 4.3 10*3/uL (ref 4.0–10.5)
nRBC: 0 % (ref 0.0–0.2)

## 2020-04-25 LAB — TROPONIN I (HIGH SENSITIVITY): Troponin I (High Sensitivity): 6 ng/L (ref <18)

## 2020-04-25 LAB — BASIC METABOLIC PANEL
Anion gap: 7 (ref 5–15)
BUN: 15 mg/dL (ref 6–20)
CO2: 27 mmol/L (ref 22–32)
Calcium: 9.6 mg/dL (ref 8.9–10.3)
Chloride: 103 mmol/L (ref 98–111)
Creatinine, Ser: 1.02 mg/dL — ABNORMAL HIGH (ref 0.44–1.00)
GFR, Estimated: >60 mL/min (ref >60)
Glucose, Bld: 81 mg/dL (ref 70–99)
Potassium: 4 mmol/L (ref 3.5–5.1)
Sodium: 137 mmol/L (ref 135–145)

## 2020-04-25 NOTE — ED Notes (Signed)
Pt complains of worsening of symptoms. Pt reports that she is in a large amount of pain and that she continues to get worse. She states she doesn't know how much longer she will be able to sit out here. RN notified.

## 2020-04-25 NOTE — ED Triage Notes (Signed)
Pt c/o right arm pain. Injured right hand one month ago (cut that needed stitches that she did not get due to financial problems).

## 2020-04-26 LAB — TROPONIN I (HIGH SENSITIVITY): Troponin I (High Sensitivity): 7 ng/L (ref <18)

## 2020-04-26 MED ORDER — MELOXICAM 15 MG PO TABS: 15.0000 mg | ORAL_TABLET | Freq: Every day | ORAL | 0 refills | Status: AC

## 2020-04-26 MED ORDER — DIAZEPAM 5 MG PO TABS: 5.0000 mg | ORAL_TABLET | Freq: Four times a day (QID) | ORAL | 0 refills | Status: AC | PRN

## 2020-04-26 NOTE — ED Provider Notes (Incomplete)
[facility] EMERGENCY DEPARTMENT Provider Note   CSN: [id number] Arrival date & time: 04/25/20  1836     History Chief Complaint  Patient presents with  . Arm Problem    Inez Mulroy is a 50 y.o. female.  HPI     Past Medical History:  Diagnosis Date  . Lupus (HCC)   . Thrombocythemia     There are no problems to display for this patient.   Past Surgical History:  Procedure Laterality Date  . CARPAL TUNNEL RELEASE    . KNEE ARTHROSCOPY W/ LATERAL RETINACULAR REPAIR       OB History   No obstetric history on file.     History reviewed. No pertinent family history.  Social History   Tobacco Use  . Smoking status: Former Games developer  . Smokeless tobacco: Never Used  Vaping Use  . Vaping Use: Never used  Substance Use Topics  . Alcohol use: Yes  . Drug use: Yes    Types: Marijuana    Home Medications Prior to Admission medications   Medication Sig Start Date End Date Taking? Authorizing Provider  diazepam (VALIUM) 5 MG tablet Take 1 tablet (5 mg total) by mouth every 6 (six) hours as needed (spasms). 04/26/20  Yes Mesner, Barbara Cower, MD  meloxicam (MOBIC) 15 MG tablet Take 1 tablet (15 mg total) by mouth daily. 04/26/20  Yes Mesner, Barbara Cower, MD    Allergies    Cephalosporins and Tramadol  Review of Systems   Review of Systems  Physical Exam Updated Vital Signs BP (!) 153/101   Pulse (!) 47   Temp 98.4 F (36.9 C) (Oral)   Resp 16   Ht 5\' 7"  (1.702 m)   Wt 74.4 kg   SpO2 100%   BMI 25.69 kg/m   Physical Exam  ED Results / Procedures / Treatments   Labs (all labs ordered are listed, but only abnormal results are displayed) Labs Reviewed  BASIC METABOLIC PANEL - Abnormal; Notable for the following components:      Result Value   Creatinine, Ser 1.02 (*)    All other components within normal limits  CBC  TROPONIN I (HIGH SENSITIVITY)  TROPONIN I (HIGH SENSITIVITY)    EKG EKG Interpretation  Date/Time:  Wednesday April 25 2020 20:43:18 EST Ventricular Rate:  45 PR Interval:  146 QRS Duration: 94 QT Interval:  434 QTC Calculation: 375 R Axis:   80 Text Interpretation: Sinus bradycardia Minimal voltage criteria for LVH, may be normal variant ( Sokolow-Lyon ) Borderline ECG No old tracing to compare Confirmed by [id number] [phone]) on 04/26/2020 1:29:23 AM   Radiology DG Chest 2 View  Result Date: 04/25/2020 CLINICAL DATA:  Pain EXAM: CHEST - 2 VIEW COMPARISON:  None. FINDINGS: There is scattered areas of scarring bilaterally, greatest within the right lower and right mid lung zones. There is no pneumothorax. No large pleural effusion. The heart size is unremarkable. There is no acute osseous abnormality. IMPRESSION: No active cardiopulmonary disease. Electronically Signed   By: 06/25/2020 M.D.   On: 04/25/2020 21:02    Procedures Procedures {Remember to document critical care time when appropriate:1}  Medications Ordered in ED Medications - No data to display  ED Course  I have reviewed the triage vital signs and the nursing notes.  Pertinent labs & imaging results that were available during my care of the patient were reviewed by me and considered in my medical decision making (see chart for details).  MDM Rules/Calculators/A&P                          *** Final Clinical Impression(s) / ED Diagnoses Final diagnoses:  Neck pain  Pain of right upper extremity    Rx / DC Orders ED Discharge Orders         Ordered    diazepam (VALIUM) 5 MG tablet  Every 6 hours PRN        04/26/20 0147    meloxicam (MOBIC) 15 MG tablet  Daily        04/26/20 0148

## 2020-04-26 NOTE — ED Provider Notes (Signed)
[facility] EMERGENCY DEPARTMENT Provider Note   CSN: [id number] Arrival date & time: 04/25/20  1836     History Chief Complaint  Patient presents with  . Arm Problem    Jody Ramos is a 50 y.o. female.  Overall patient appears wel.  I suspect that she has some component of anxiety along with some muscular back pain.  Patient states it all started in her back few weeks ago.  She states it does radiate up to her neck into her shoulder.  And today it hurt so bad she really worried about it and her chest pain started.  Some mild shortness of breath with it but it is improved.  She states she is history of lupus and had an infection in her finger and is worried that she might get a blood clot in her arm and has a PE because of it now.        Past Medical History:  Diagnosis Date  . Lupus (HCC)   . Thrombocythemia     There are no problems to display for this patient.   Past Surgical History:  Procedure Laterality Date  . CARPAL TUNNEL RELEASE    . KNEE ARTHROSCOPY W/ LATERAL RETINACULAR REPAIR       OB History   No obstetric history on file.     History reviewed. No pertinent family history.  Social History   Tobacco Use  . Smoking status: Former Games developer  . Smokeless tobacco: Never Used  Vaping Use  . Vaping Use: Never used  Substance Use Topics  . Alcohol use: Yes  . Drug use: Yes    Types: Marijuana    Home Medications Prior to Admission medications   Medication Sig Start Date End Date Taking? Authorizing Provider  diazepam (VALIUM) 5 MG tablet Take 1 tablet (5 mg total) by mouth every 6 (six) hours as needed (spasms). 04/26/20  Yes Cheryn Lundquist, Barbara Cower, MD  meloxicam (MOBIC) 15 MG tablet Take 1 tablet (15 mg total) by mouth daily. 04/26/20  Yes Tierra Divelbiss, Barbara Cower, MD    Allergies    Cephalosporins and Tramadol  Review of Systems   Review of Systems  All other systems reviewed and are negative.   Physical Exam Updated Vital Signs BP (!)  153/101   Pulse (!) 47   Temp 98.4 F (36.9 C) (Oral)   Resp 16   Ht 5\' 7"  (1.702 m)   Wt 74.4 kg   SpO2 100%   BMI 25.69 kg/m   Physical Exam Vitals and nursing note reviewed.  Constitutional:      Appearance: She is well-developed.  HENT:     Head: Normocephalic and atraumatic.     Nose: Nose normal. No congestion or rhinorrhea.     Mouth/Throat:     Mouth: Mucous membranes are moist.     Pharynx: Oropharynx is clear.  Eyes:     Pupils: Pupils are equal, round, and reactive to light.  Cardiovascular:     Rate and Rhythm: Normal rate and regular rhythm.  Pulmonary:     Effort: No respiratory distress.     Breath sounds: No stridor.  Abdominal:     General: Abdomen is flat. There is no distension.  Musculoskeletal:        General: Tenderness (to right trapezius area and right periscapular area) present.     Cervical back: Normal range of motion.  Skin:    General: Skin is warm and dry.  Neurological:  General: No focal deficit present.     Mental Status: She is alert.     ED Results / Procedures / Treatments   Labs (all labs ordered are listed, but only abnormal results are displayed) Labs Reviewed  BASIC METABOLIC PANEL - Abnormal; Notable for the following components:      Result Value   Creatinine, Ser 1.02 (*)    All other components within normal limits  CBC  TROPONIN I (HIGH SENSITIVITY)  TROPONIN I (HIGH SENSITIVITY)    EKG EKG Interpretation  Date/Time:  Wednesday April 25 2020 20:43:18 EST Ventricular Rate:  45 PR Interval:  146 QRS Duration: 94 QT Interval:  434 QTC Calculation: 375 R Axis:   80 Text Interpretation: Sinus bradycardia Minimal voltage criteria for LVH, may be normal variant ( Sokolow-Lyon ) Borderline ECG No old tracing to compare Confirmed by Marily Memos [phone]) on 04/26/2020 1:29:23 AM   Radiology DG Chest 2 View  Result Date: 04/25/2020 CLINICAL DATA:  Pain EXAM: CHEST - 2 VIEW COMPARISON:  None. FINDINGS: There is  scattered areas of scarring bilaterally, greatest within the right lower and right mid lung zones. There is no pneumothorax. No large pleural effusion. The heart size is unremarkable. There is no acute osseous abnormality. IMPRESSION: No active cardiopulmonary disease. Electronically Signed   By: Katherine Mantle M.D.   On: 04/25/2020 21:02    Procedures Procedures   Medications Ordered in ED Medications - No data to display  ED Course  I have reviewed the triage vital signs and the nursing notes.  Pertinent labs & imaging results that were available during my care of the patient were reviewed by me and considered in my medical decision making (see chart for details).    MDM Rules/Calculators/A&P                          Suspect likely muscular spasm and associated anxiety.  Low suspicion for ACS, PE or any other acute issues at this time  Final Clinical Impression(s) / ED Diagnoses Final diagnoses:  Neck pain  Pain of right upper extremity    Rx / DC Orders ED Discharge Orders         Ordered    diazepam (VALIUM) 5 MG tablet  Every 6 hours PRN        04/26/20 0147    meloxicam (MOBIC) 15 MG tablet  Daily        04/26/20 0148           Jordin Dambrosio, Barbara Cower, MD 04/27/20 [phone]

## 2020-04-26 NOTE — ED Notes (Signed)
Patient has been brought back for the second time complaining of crushing chest pain. Pt is tearful and feels that her needs/concerns are not being addressed. Pt was reassessed earlier and deemed to be stable. Pt is here again, and we are performing a repeat EKG and repeat vitals. D-Dimer to to added to lab work as well.

## 2022-06-02 IMAGING — DX DG CHEST 2V
2 series · 2 of 2 positions shown · non-contrast
Comparison: None.

CLINICAL DATA: Pain

EXAM:
CHEST - 2 VIEW

[chest pa]
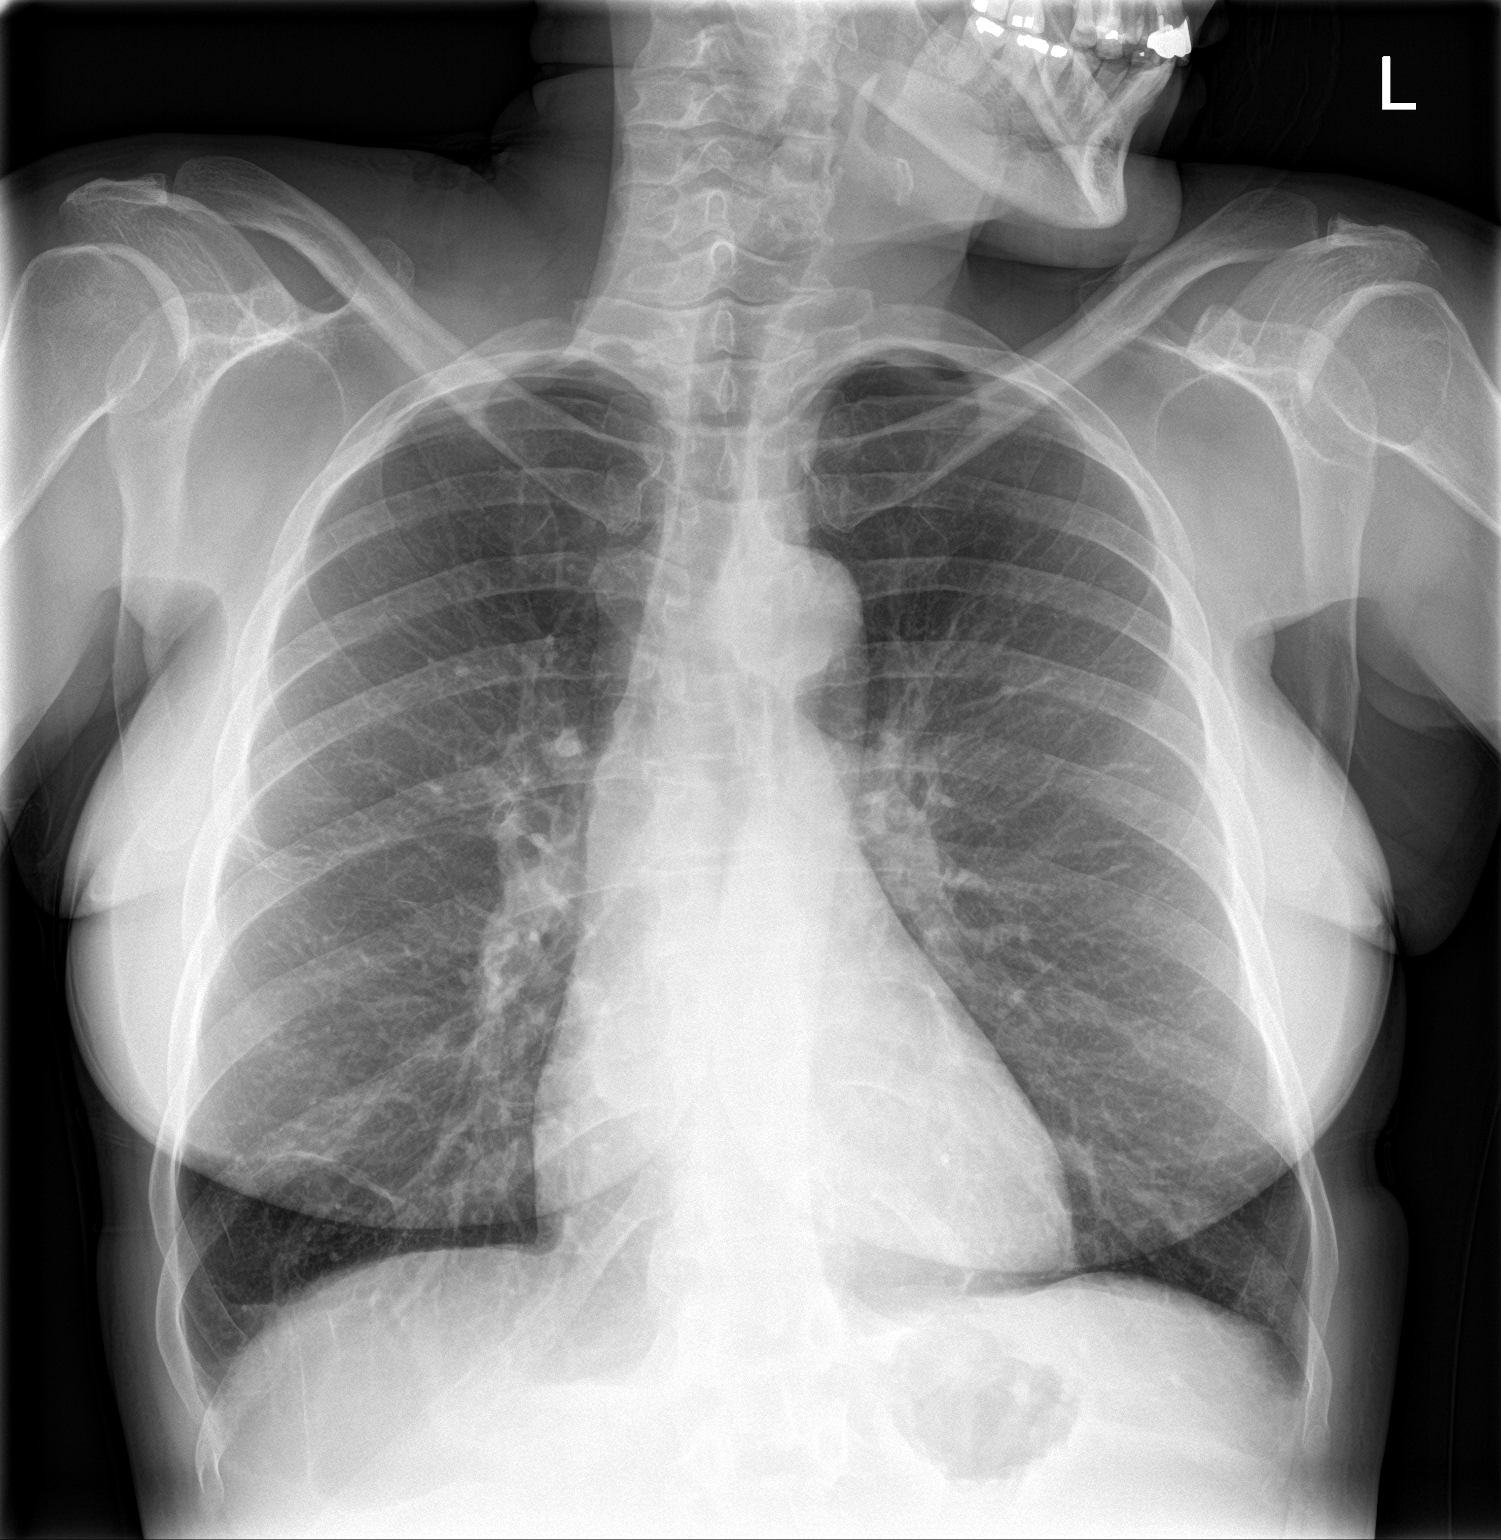

[chest lat]
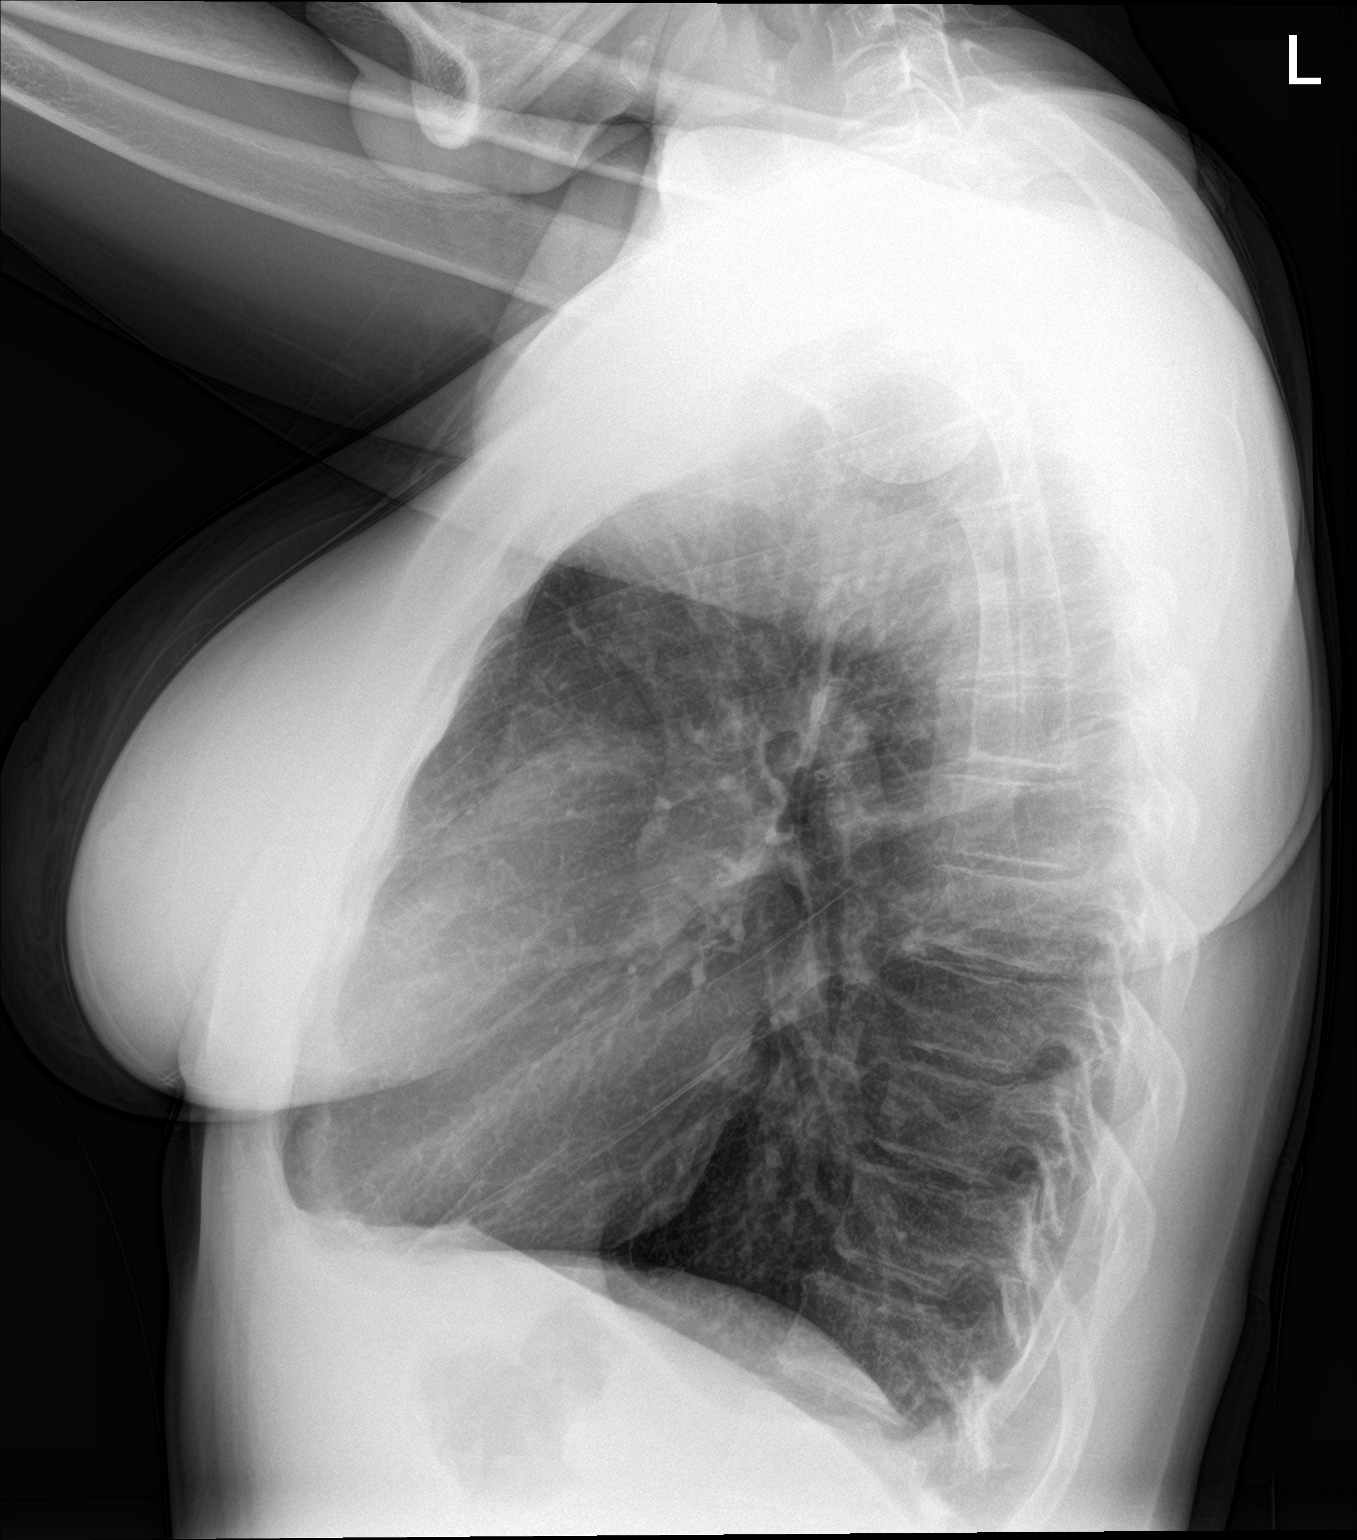

[2 of 2 positions shown; findings below may reference images not displayed]

FINDINGS: There is scattered areas of scarring bilaterally, greatest within
the right lower and right mid lung zones. There is no pneumothorax.
No large pleural effusion. The heart size is unremarkable. There is
no acute osseous abnormality.
IMPRESSION: No active cardiopulmonary disease.

## 8386-10-19 DEATH — deceased
